# Patient Record
Sex: Male | Born: 1955 | Race: White | Hispanic: No | Marital: Married | Smoking: Never smoker
Health system: Southern US, Community
[De-identification: ages and names within clinical notes are randomized; demographics above are authoritative.]

## PROBLEM LIST (undated history)

## (undated) DIAGNOSIS — M549 Dorsalgia, unspecified: Secondary | ICD-10-CM

## (undated) DIAGNOSIS — Z87442 Personal history of urinary calculi: Secondary | ICD-10-CM

## (undated) DIAGNOSIS — N400 Enlarged prostate without lower urinary tract symptoms: Secondary | ICD-10-CM

## (undated) HISTORY — PX: TONSILLECTOMY: SUR1361

## (undated) HISTORY — DX: Dorsalgia, unspecified: M54.9

## (undated) HISTORY — PX: VASECTOMY: SHX75

## (undated) HISTORY — DX: Benign prostatic hyperplasia without lower urinary tract symptoms: N40.0

## (undated) HISTORY — PX: KNEE ARTHROCENTESIS: SUR44

## (undated) HISTORY — DX: Personal history of urinary calculi: Z87.442

## (undated) HISTORY — PX: ARTHROSCOPIC REPAIR ACL: SUR80

---

## 2011-05-19 ENCOUNTER — Emergency Department
Admission: EM | Admit: 2011-05-19 | Discharge: 2011-05-19 | Disposition: A | Discharge status: Home / Self Care | Payer: BC Managed Care – PPO | Source: Home / Self Care | Attending: Emergency Medicine | Admitting: Emergency Medicine

## 2011-05-19 DIAGNOSIS — R300 Dysuria: Secondary | ICD-10-CM

## 2011-05-19 LAB — POCT URINALYSIS DIP (MANUAL ENTRY)
Ketones, POC UA: NEGATIVE
Protein Ur, POC: NEGATIVE
Spec Grav, UA: >=1.03 (ref 1.005–1.03)

## 2011-05-19 MED ORDER — FLUCONAZOLE 150 MG PO TABS: 150.0000 mg | ORAL_TABLET | Freq: Once | ORAL | Status: AC

## 2011-05-19 MED ORDER — CIPROFLOXACIN HCL 500 MG PO TABS: 500.0000 mg | ORAL_TABLET | Freq: Two times a day (BID) | ORAL | Status: AC

## 2011-05-19 NOTE — ED Provider Notes (Signed)
History     CSN: [id number]  Arrival date & time 05/19/11  1748   First MD Initiated Contact with Patient 05/19/11 1757      Chief Complaint  Patient presents with  . Dysuria    (Consider location/radiation/quality/duration/timing/severity/associated sxs/prior treatment) Patient is a 56 y.o. male presenting with dysuria.  Dysuria   Garyn is a 56 y.o. male who presents today with UTI symptoms for a few days. His wife did have symptoms recently and has been treated, but he is unsure what she was treated for. No dysuria No frequency No urgency No hematuria + odor + white penile discharge No fever/chills + L lower abdominal pain No back pain No fatigue    History reviewed. No pertinent past medical history.  History reviewed. No pertinent past surgical history.  History reviewed. No pertinent family history.  History  Substance Use Topics  . Smoking status: Never Smoker   . Smokeless tobacco: Not on file  . Alcohol Use: No      Review of Systems  Genitourinary: Positive for dysuria.    Allergies  Review of patient's allergies indicates no known allergies.  Home Medications   Current Outpatient Rx  Name Route Sig Dispense Refill  . CIPROFLOXACIN HCL 500 MG PO TABS Oral Take 1 tablet (500 mg total) by mouth 2 (two) times daily. 14 tablet 0  . FLUCONAZOLE 150 MG PO TABS Oral Take 1 tablet (150 mg total) by mouth once. May repeat in 3 days 2 tablet 0    BP 120/80  Pulse 60  Temp(Src) 98.3 F (36.8 C) (Oral)  Ht 5\' 8"  (1.727 m)  Wt 160 lb 8 oz (72.802 kg)  BMI 24.40 kg/m2  SpO2 97%  Physical Exam  Nursing note and vitals reviewed. Constitutional: He is oriented to person, place, and time. He appears well-developed and well-nourished.  HENT:  Head: Normocephalic and atraumatic.  Eyes: No scleral icterus.  Neck: Neck supple.  Cardiovascular: Regular rhythm and normal heart sounds.   Pulmonary/Chest: Effort normal and breath sounds normal. No  respiratory distress.  Abdominal: Soft. Normal appearance and bowel sounds are normal. He exhibits no mass. There is no rebound, no guarding and no CVA tenderness.  Neurological: He is alert and oriented to person, place, and time.  Skin: Skin is warm and dry.  Psychiatric: He has a normal mood and affect. His speech is normal.    ED Course  Procedures (including critical care time)   Labs Reviewed  POCT URINALYSIS DIP (MANUAL ENTRY)  URINE CULTURE   No results found.   1. Dysuria       MDM  1) Take the prescribed antibiotic as directed.   Differential diagnosis includes urethritis, urinary tract infection, yeast infection.  Prescription for Cipro and Diflucan given. We'll send off a urine gonorrhea and Chlamydia. 2) A urinalysis was done in clinic.  A urine culture is pending.[name] 3) Follow up with your PCP or urologist if not improving or if worsening symptoms.   Lily Kocher, MD 05/19/11 [phone]

## 2011-05-19 NOTE — ED Notes (Signed)
Noted odor to urine with white discharge

## 2011-05-21 LAB — URINE CULTURE
Colony Count: NO GROWTH
Organism ID, Bacteria: NO GROWTH

## 2011-06-11 ENCOUNTER — Emergency Department
Admission: EM | Admit: 2011-06-11 | Discharge: 2011-06-11 | Disposition: A | Discharge status: Home / Self Care | Payer: BC Managed Care – PPO | Source: Home / Self Care | Attending: Emergency Medicine | Admitting: Emergency Medicine

## 2011-06-11 ENCOUNTER — Encounter: Payer: Self-pay | Admitting: Emergency Medicine

## 2011-06-11 DIAGNOSIS — R059 Cough, unspecified: Secondary | ICD-10-CM

## 2011-06-11 DIAGNOSIS — R05 Cough: Secondary | ICD-10-CM

## 2011-06-11 DIAGNOSIS — J069 Acute upper respiratory infection, unspecified: Secondary | ICD-10-CM

## 2011-06-11 MED ORDER — AMOXICILLIN-POT CLAVULANATE 875-125 MG PO TABS: 1.0000 | ORAL_TABLET | Freq: Two times a day (BID) | ORAL | Status: AC

## 2011-06-11 NOTE — ED Provider Notes (Addendum)
History     CSN: [id number]  Arrival date & time 06/11/11  1502   First MD Initiated Contact with Patient 06/11/11 1533      Chief Complaint  Patient presents with  . Cough    (Consider location/radiation/quality/duration/timing/severity/associated sxs/prior treatment) HPI Dajion is a 56 y.o. male who complains of onset of cold symptoms for a month, intermittent but worsening last few days. He works in a high school.  His daughter was here earlier today with similar symptoms, likely viral. + sore throat + cough No pleuritic pain No wheezing + nasal congestion + post-nasal drainage + sinus pain/pressure + chest congestion No itchy/red eyes No earache No hemoptysis No SOB No chills/sweats No fever No nausea No vomiting No abdominal pain No diarrhea No skin rashes No fatigue No myalgias No headache    History reviewed. No pertinent past medical history.  Past Surgical History  Procedure Date  . Knee arthrocentesis     Family History  Problem Relation Age of Onset  . Diabetes Maternal Uncle   . Diabetes Maternal Aunt     History  Substance Use Topics  . Smoking status: Never Smoker   . Smokeless tobacco: Not on file  . Alcohol Use: No      Review of Systems  All other systems reviewed and are negative.    Allergies  Review of patient's allergies indicates no known allergies.  Home Medications   Current Outpatient Rx  Name Route Sig Dispense Refill  . AMOXICILLIN-POT CLAVULANATE 875-125 MG PO TABS Oral Take 1 tablet by mouth 2 (two) times daily. 20 tablet 0    BP 111/75  Pulse 71  Temp(Src) 98.1 F (36.7 C) (Oral)  Resp 18  Ht 5\' 8"  (1.727 m)  Wt 158 lb (71.668 kg)  BMI 24.02 kg/m2  SpO2 98%  Physical Exam  Nursing note and vitals reviewed. Constitutional: He is oriented to person, place, and time. He appears well-developed and well-nourished.  HENT:  Head: Normocephalic and atraumatic.  Right Ear: Tympanic membrane, external ear  and ear canal normal.  Left Ear: Tympanic membrane, external ear and ear canal normal.  Nose: Mucosal edema and rhinorrhea present.  Mouth/Throat: Posterior oropharyngeal erythema present. No oropharyngeal exudate or posterior oropharyngeal edema.  Eyes: No scleral icterus.  Neck: Neck supple.  Cardiovascular: Regular rhythm and normal heart sounds.   Pulmonary/Chest: Effort normal and breath sounds normal. No respiratory distress.  Neurological: He is alert and oriented to person, place, and time.  Skin: Skin is warm and dry.  Psychiatric: He has a normal mood and affect. His speech is normal.    ED Course  Procedures (including critical care time)  Labs Reviewed - No data to display No results found.   1. Cough   2. Acute upper respiratory infections of unspecified site       MDM  1)  Take the prescribed antibiotic as instructed. 2)  Use nasal saline solution (over the counter) at least 3 times a day. 3)  Use over the counter decongestants like Zyrtec-D every 12 hours as needed to help with congestion.  If you have hypertension, do not take medicines with sudafed.  4)  Can take tylenol every 6 hours or motrin every 8 hours for pain or fever. 5)  Follow up with your primary doctor if no improvement in 5-7 days, sooner if increasing pain, fever, or new symptoms.        Lily Kocher, MD 06/11/11 1536  I have advised  him that if he is not getting better with over-the-counter cough medicine, that he can call back in a few days and we will call in some Cheratussin for him.  Lily Kocher, MD 06/11/11 1537

## 2011-06-11 NOTE — ED Notes (Signed)
Cough intermittently x 1 month; worsened 2 days ago; sinus congestion and hoarseness; rib pain.

## 2011-06-22 ENCOUNTER — Emergency Department
Admission: EM | Admit: 2011-06-22 | Discharge: 2011-06-22 | Disposition: A | Discharge status: Home / Self Care | Payer: BC Managed Care – PPO | Source: Home / Self Care | Attending: Emergency Medicine | Admitting: Emergency Medicine

## 2011-06-22 ENCOUNTER — Emergency Department
Admit: 2011-06-22 | Discharge: 2011-06-22 | Disposition: A | Discharge status: Home / Self Care | Payer: BC Managed Care – PPO

## 2011-06-22 DIAGNOSIS — R05 Cough: Secondary | ICD-10-CM

## 2011-06-22 DIAGNOSIS — R059 Cough, unspecified: Secondary | ICD-10-CM

## 2011-06-22 DIAGNOSIS — J069 Acute upper respiratory infection, unspecified: Secondary | ICD-10-CM

## 2011-06-22 MED ORDER — FLUTICASONE PROPIONATE 50 MCG/ACT NA SUSP: 2.0000 | Freq: Every day | NASAL | Status: DC

## 2011-06-22 MED ORDER — CLARITHROMYCIN 500 MG PO TABS: 500.0000 mg | ORAL_TABLET | Freq: Two times a day (BID) | ORAL | Status: AC

## 2011-06-22 MED ORDER — RANITIDINE HCL 150 MG PO TABS: 150.0000 mg | ORAL_TABLET | Freq: Two times a day (BID) | ORAL | Status: DC

## 2011-06-22 NOTE — ED Notes (Signed)
Cough x 6 weeks, just completed ABT

## 2011-06-22 NOTE — ED Provider Notes (Signed)
History     CSN: [id number]  Arrival date & time 06/22/11  1731   First MD Initiated Contact with Patient 06/22/11 1734      Chief Complaint  Patient presents with  . Cough  . Back Pain    (Consider location/radiation/quality/duration/timing/severity/associated sxs/prior treatment) HPI Jake Pearson is a 56 y.o. male who complains of cold symptoms.  He was here 2 weeks with similar symptoms and given 10 days of Augmentin.  Prior to that, he had been sick off and on for about a month.  He has a history of but has not had any reflux symptoms lately. He did not have a history of asthma or allergies. 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 No sore throat + cough with mild L sided back pain ? pleuritic pain No wheezing No nasal congestion +post-nasal drainage No sinus pain/pressure + chest congestion No itchy/red eyes No earache No hemoptysis No SOB No chills/sweats No fever No nausea No vomiting No abdominal pain No diarrhea No skin rashes No fatigue No myalgias No headache    History reviewed. No pertinent past medical history.  Past Surgical History  Procedure Date  . Knee arthrocentesis     Family History  Problem Relation Age of Onset  . Diabetes Maternal Uncle   . Diabetes Maternal Aunt     History  Substance Use Topics  . Smoking status: Never Smoker   . Smokeless tobacco: Not on file  . Alcohol Use: No      Review of Systems  All other systems reviewed and are negative.    Allergies  Review of patient's allergies indicates no known allergies.  Home Medications   Current Outpatient Rx  Name Route Sig Dispense Refill  . AMOXICILLIN-POT CLAVULANATE 875-125 MG PO TABS Oral Take 1 tablet by mouth 2 (two) times daily. 20 tablet 0  . CLARITHROMYCIN 500 MG PO TABS Oral Take 1 tablet (500 mg total) by mouth 2 (two)  times daily. 20 tablet 0  . FLUTICASONE PROPIONATE 50 MCG/ACT NA SUSP Nasal Place 2 sprays into the nose daily. 16 g 1  . RANITIDINE HCL 150 MG PO TABS Oral Take 1 tablet (150 mg total) by mouth 2 (two) times daily. 60 tablet 0    BP 123/78  Pulse 70  Temp(Src) 97.9 F (36.6 C) (Oral)  Resp 20  Ht 5\' 8"  (1.727 m)  Wt 163 lb 8 oz (74.163 kg)  BMI 24.86 kg/m2  SpO2 95%  Physical Exam  Nursing note and vitals reviewed. Constitutional: He is oriented to person, place, and time. He appears well-developed and well-nourished.  HENT:  Head: Normocephalic and atraumatic.  Right Ear: External ear and ear canal normal.  Left Ear: External ear and ear canal normal.  Nose: Nose normal.  Mouth/Throat: No oropharyngeal exudate, posterior oropharyngeal edema or posterior oropharyngeal erythema.       Bilateral clear fluid and mild erythema  Eyes: No scleral icterus.  Neck: Neck supple.  Cardiovascular: Regular rhythm and normal heart sounds.   Pulmonary/Chest: Effort normal and breath sounds normal. No respiratory distress.  Neurological: He is alert and oriented to person, place, and time.  Skin: Skin is warm and dry.  Psychiatric: He has a normal mood and affect. His speech is normal.    ED Course  Procedures (including critical care time)  Labs Reviewed -  No data to display Dg Chest 2 View  06/22/2011  *RADIOLOGY REPORT*  Clinical Data: Persistent cough  CHEST - 2 VIEW  Comparison:  None.  Findings:  The heart size and mediastinal contours are within normal limits.  Both lungs are clear.  The visualized skeletal structures are unremarkable.  IMPRESSION: No active cardiopulmonary disease.  Original Report Authenticated By: Judie Petit. TREVOR Miles Costain, M.D.     1. Cough   2. Acute upper respiratory infections of unspecified site       MDM  1)  Chest Xray is obtained and read by the radiologist as above (normal). I discussed with the patient the differential diagnosis of a chronic cough  including infection which he took Augmentin, however this would not cover pertussis so will prescribe clarithromycin for 10 days. In addition it could very likely be GERD and I've advised him to use over-the-counter Zantac for the next 3-4 weeks. In addition treating with a nasal steroid such as Flonase may be appropriate to try.  Use nasal saline solution (over the counter) at least 3 times a day. If his symptoms are continuing, then the next appropriate followup with an ENT or his PCP.    Lily Kocher, MD 06/22/11 [phone]

## 2012-10-23 ENCOUNTER — Encounter: Payer: Self-pay | Admitting: Family Medicine

## 2012-10-23 ENCOUNTER — Ambulatory Visit (INDEPENDENT_AMBULATORY_CARE_PROVIDER_SITE_OTHER): Payer: BC Managed Care – PPO | Admitting: Family Medicine

## 2012-10-23 VITALS — BP 109/77 | HR 65 | Ht 68.0 in | Wt 164.0 lb

## 2012-10-23 DIAGNOSIS — M25512 Pain in left shoulder: Secondary | ICD-10-CM

## 2012-10-23 DIAGNOSIS — Z0000 Encounter for general adult medical examination without abnormal findings: Secondary | ICD-10-CM

## 2012-10-23 DIAGNOSIS — M25519 Pain in unspecified shoulder: Secondary | ICD-10-CM

## 2012-10-23 MED ORDER — CYCLOBENZAPRINE HCL 10 MG PO TABS: 10.0000 mg | ORAL_TABLET | Freq: Every evening | ORAL | Status: DC | PRN

## 2012-10-23 MED ORDER — MELOXICAM 7.5 MG PO TABS: 7.5000 mg | ORAL_TABLET | Freq: Every day | ORAL | Status: DC

## 2012-10-23 NOTE — Patient Instructions (Signed)
Please schedule a physical at time.  Call if not better in 3 weeeks with your shoulder.

## 2012-10-23 NOTE — Progress Notes (Signed)
Subjective:    Patient ID: Jake Pearson, male    DOB: 1955/11/23, 57 y.o.   MRN: [id number]  HPI Here to estab care. No meds on regular basis.  left shoulder pain x 1 week no injury pt stated that pain began after waking up one morning.  Would occ bother him on and off x 1 years. Occ pops. Occ feels stiff. Worse at night this last week.  No old injuries. Harder to reach full ROM.  No known injuries. Worse with activity and better at rest. No radiation of pain. The he has noticed some discomfort in his neck as well.   Review of Systems  Constitutional: Negative for fever, diaphoresis and unexpected weight change.  HENT: Negative for hearing loss, rhinorrhea, sneezing and tinnitus.   Eyes: Negative for visual disturbance.  Respiratory: Negative for cough and wheezing.   Cardiovascular: Negative for chest pain and palpitations.  Gastrointestinal: Negative for nausea, vomiting, diarrhea and blood in stool.  Genitourinary: Negative for dysuria and discharge.  Musculoskeletal: Positive for arthralgias. Negative for myalgias.  Skin: Negative for rash.  Neurological: Negative for headaches.  Hematological: Negative for adenopathy.  Psychiatric/Behavioral: Negative for sleep disturbance and dysphoric mood. The patient is not nervous/anxious.        BP 109/77  Pulse 65  Ht 5\' 8"  (1.727 m)  Wt 164 lb (74.39 kg)  BMI 24.94 kg/m2    No Known Allergies  No past medical history on file.  Past Surgical History  Procedure Laterality Date  . Knee arthrocentesis    . Tonsillectomy  Age 29   . Arthroscopic repair acl    . Vasectomy      History   Social History  . Marital Status: Married    Spouse Name: N/A    Number of Children: 2  . Years of Education: N/A   Occupational History  .     Social History Main Topics  . Smoking status: Never Smoker   . Smokeless tobacco: Not on file  . Alcohol Use: 0.5 oz/week    1 drink(s) per week  . Drug Use: No  . Sexually Active: Yes --  Male partner(s)   Other Topics Concern  . 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  Has 2 kids.     Family History  Problem Relation Age of Onset  . Diabetes Maternal Uncle   . Diabetes Maternal Aunt   . Heart attack Maternal Grandfather   . Heart attack Maternal Uncle     Outpatient Encounter Prescriptions as of 10/23/2012  Medication Sig Dispense Refill  . cyclobenzaprine (FLEXERIL) 10 MG tablet Take 1 tablet (10 mg total) by mouth at bedtime as needed for muscle spasms.  30 tablet  0  . meloxicam (MOBIC) 7.5 MG tablet Take 1-2 tablets (7.5-15 mg total) by mouth daily.  60 tablet  0  . [DISCONTINUED] fluticasone (FLONASE) 50 MCG/ACT nasal spray Place 2 sprays into the nose daily.  16 g  1  . [DISCONTINUED] ranitidine (ZANTAC) 150 MG tablet Take 1 tablet (150 mg total) by mouth 2 (two) times daily.  60 tablet  0   No facility-administered encounter medications on file as of 10/23/2012.       Objective:   Physical Exam  Constitutional: He is oriented to person, place, and time. He appears well-developed and well-nourished.  HENT:  Head: Normocephalic and atraumatic.  Cardiovascular: Normal rate, regular rhythm and normal heart sounds.  Pulmonary/Chest: Effort normal and breath sounds normal.  Musculoskeletal:  Left shoulder with NROM except decreased reach across.  Neg empty can test.  Shoulder, elbow strencth is 5/5.  Neck with NROM and pain with rotation and flexion to the left. Nontender over the actual shoulder joint. Did have some tenderness over the upper trapezius muscle  Neurological: He is alert and oriented to person, place, and time.  Skin: Skin is warm and dry.  Psychiatric: He has a normal mood and affect. His behavior is normal.          Assessment & Plan:  Left shoulder pain- most consistent with trapezius strain. He is actually tender over the top of the trapezius muscle itself. We will try a muscle extra bedtime and see  if this helps it may also help with his sleep quality since the pain often is keeping him awake. I think he also has some component of possible bursitis. No sign of rotator cuff tear or problems. Recommend a perception anti-inflammatory, Mobic. Take with food and water to avoid any GI upset. If not noticing significant improvement in the next 2-3 weeks in followup for further evaluation and treatment. No actual, or injury I did not do any x-rays today. He may have some mild component of impingement syndrome as well as he did have discomfort with crossing over to the other shoulder. Given handout on stretches for the neck and for the shoulder. Encouraged him to start these today.   He plans on scheduling a physical later this month. He would like to go ahead and get a lab slip. The last time he had blood work done was about 6 years ago. He does report having had a colonoscopy at age 17 that was normal. He says he really can't remember where he went. He also feels that his tetanus is up-to-date.

## 2012-10-25 LAB — LIPID PANEL
LDL Cholesterol: 106 mg/dL — ABNORMAL HIGH (ref 0–99)
VLDL: 20 mg/dL (ref 0–40)

## 2012-10-25 LAB — COMPLETE METABOLIC PANEL WITH GFR
ALT: 37 U/L (ref 0–53)
AST: 22 U/L (ref 0–37)
Albumin: 4.2 g/dL (ref 3.5–5.2)
Calcium: 8.9 mg/dL (ref 8.4–10.5)
Chloride: 106 mEq/L (ref 96–112)
Potassium: 4.2 mEq/L (ref 3.5–5.3)

## 2012-10-31 ENCOUNTER — Encounter: Payer: BC Managed Care – PPO | Admitting: Family Medicine

## 2012-11-02 ENCOUNTER — Emergency Department (INDEPENDENT_AMBULATORY_CARE_PROVIDER_SITE_OTHER)
Admission: EM | Admit: 2012-11-02 | Discharge: 2012-11-02 | Disposition: A | Discharge status: Home / Self Care | Payer: BC Managed Care – PPO | Source: Home / Self Care | Attending: Family Medicine | Admitting: Family Medicine

## 2012-11-02 ENCOUNTER — Encounter: Payer: Self-pay | Admitting: *Deleted

## 2012-11-02 DIAGNOSIS — R300 Dysuria: Secondary | ICD-10-CM

## 2012-11-02 LAB — POCT URINALYSIS DIP (MANUAL ENTRY)
Nitrite, UA: NEGATIVE
Urobilinogen, UA: 0.2 (ref 0–1)
pH, UA: 7 (ref 5–8)

## 2012-11-02 MED ORDER — DOXYCYCLINE HYCLATE 100 MG PO CAPS: 100.0000 mg | ORAL_CAPSULE | Freq: Two times a day (BID) | ORAL | Status: DC

## 2012-11-02 NOTE — ED Notes (Signed)
Pt c/o dysuria, frequent urination and penile d/c x 4 days. Denies fever. No OTC meds.

## 2012-11-02 NOTE — ED Provider Notes (Signed)
History    CSN: [id number] Arrival date & time 11/02/12  1140  None    Chief Complaint  Patient presents with  . Dysuria      HPI Comments: Patient complains of 4 day history of frequency, dysuria, nocturia, and urethral discharge.  No fevers, chills, and sweats, and no testicular pain.  He has had mild nausea but no vomiting.  Patient is a 57 y.o. male presenting with dysuria. The history is provided by the patient.  Dysuria This is a new problem. Episode onset: 4 days ago. The problem occurs constantly. The problem has been gradually worsening. Associated symptoms comments: none. Nothing aggravates the symptoms. Nothing relieves the symptoms. He has tried nothing for the symptoms.   History reviewed. No pertinent past medical history. Past Surgical History  Procedure Laterality Date  . Knee arthrocentesis    . Tonsillectomy  Age 60   . Arthroscopic repair acl    . Vasectomy     Family History  Problem Relation Age of Onset  . Diabetes Maternal Uncle   . Diabetes Maternal Aunt   . Heart attack Maternal Grandfather   . Heart attack Maternal Uncle    History  Substance Use Topics  . Smoking status: Never Smoker   . Smokeless tobacco: Not on file  . Alcohol Use: 0.5 oz/week    1 drink(s) per week    Review of Systems  Genitourinary: Positive for dysuria.  All other systems reviewed and are negative.    Allergies  Review of patient's allergies indicates no known allergies.  Home Medications   Current Outpatient Rx  Name  Route  Sig  Dispense  Refill  . cyclobenzaprine (FLEXERIL) 10 MG tablet   Oral   Take 1 tablet (10 mg total) by mouth at bedtime as needed for muscle spasms.   30 tablet   0   . doxycycline (VIBRAMYCIN) 100 MG capsule   Oral   Take 1 capsule (100 mg total) by mouth 2 (two) times daily.   14 capsule   0   . meloxicam (MOBIC) 7.5 MG tablet   Oral   Take 1-2 tablets (7.5-15 mg total) by mouth daily.   60 tablet   0    BP 117/75   Pulse 63  Temp(Src) 98.2 F (36.8 C) (Oral)  Resp 16  Ht 5\' 8"  (1.727 m)  Wt 159 lb (72.122 kg)  BMI 24.18 kg/m2  SpO2 99% Physical Exam Nursing notes and Vital Signs reviewed. Appearance:  Patient appears healthy, stated age, and in no acute distress Eyes:  Pupils are equal, round, and reactive to light and accomodation.  Extraocular movement is intact.  Conjunctivae are not inflamed  Mouth/Pharynx:  Normal Neck:  Supple.   No adenopathy Lungs:  Clear to auscultation.  Breath sounds are equal.  Heart:  Regular rate and rhythm without murmurs, rubs, or gallops.  Abdomen:  Nontender without masses or hepatosplenomegaly.  Bowel sounds are present.  No CVA or flank tenderness.  Extremities:  No edema.  No calf tenderness Skin:  No rash present.   ED Course  Procedures  None   Labs Reviewed  URINE CULTURE pending  GC/CHLAMYDIA PROBE AMP, URINE pending  POCT URINALYSIS DIP (MANUAL ENTRY) SG >= 1.030; BLO moderate; PRO 100mg /dL; LEU moderate    1. Dysuria     MDM  Urine culture, and GC/chlamydia pending Begin doxycycline Increase fluid intake. Followup with Family Doctor if not improved in one week.   Lattie Haw,  MD 11/02/12 [id number]

## 2012-11-04 ENCOUNTER — Telehealth: Payer: Self-pay

## 2012-11-04 NOTE — ED Notes (Signed)
Patient given results of labs after confirmation of password.

## 2012-11-05 ENCOUNTER — Telehealth: Payer: Self-pay

## 2012-11-05 LAB — URINE CULTURE: Colony Count: >=100000

## 2012-11-05 MED ORDER — CIPROFLOXACIN HCL 500 MG PO TABS: 500.0000 mg | ORAL_TABLET | Freq: Two times a day (BID) | ORAL | Status: DC

## 2012-11-05 NOTE — ED Notes (Signed)
Patient advised to stop the doxycycline and start cipro, per Dr Alvester Morin.

## 2012-11-06 ENCOUNTER — Encounter: Payer: BC Managed Care – PPO | Admitting: Family Medicine

## 2012-11-10 ENCOUNTER — Ambulatory Visit (INDEPENDENT_AMBULATORY_CARE_PROVIDER_SITE_OTHER): Payer: BC Managed Care – PPO | Admitting: Family Medicine

## 2012-11-10 ENCOUNTER — Encounter: Payer: Self-pay | Admitting: Family Medicine

## 2012-11-10 VITALS — BP 121/77 | HR 60 | Ht 68.0 in | Wt 162.0 lb

## 2012-11-10 DIAGNOSIS — Z0000 Encounter for general adult medical examination without abnormal findings: Secondary | ICD-10-CM

## 2012-11-10 DIAGNOSIS — Z1211 Encounter for screening for malignant neoplasm of colon: Secondary | ICD-10-CM

## 2012-11-10 NOTE — Patient Instructions (Addendum)
Shingles Vaccine What You Need to Know WHAT IS SHINGLES?  Shingles is a painful skin rash, often with blisters. It is also called Herpes Zoster or just Zoster.  A shingles rash usually appears on one side of the face or body and lasts from 2 to 4 weeks. Its main symptom is pain, which can be quite severe. Other symptoms of shingles can include fever, headache, chills, and upset stomach. Very rarely, a shingles infection can lead to pneumonia, hearing problems, blindness, brain inflammation (encephalitis), or death.  For about 1 person in 5, severe pain can continue even after the rash clears up. This is called post-herpetic neuralgia.  Shingles is caused by the Varicella Zoster virus. This is the same virus that causes chickenpox. Only someone who has had a case of chickenpox or rarely, has gotten chickenpox vaccine, can get shingles. The virus stays in your body. It can reappear many years later to cause a case of shingles.  You cannot catch shingles from another person with shingles. However, a person who has never had chickenpox (or chickenpox vaccine) could get chickenpox from someone with shingles. This is not very common.  Shingles is far more common in people 50 and older than in younger people. It is also more common in people whose immune systems are weakened because of a disease such as cancer or drugs such as steroids or chemotherapy.  At least 1 million people get shingles per year in the [place]. SHINGLES VACCINE  A vaccine for shingles was licensed in 2006. In clinical trials, the vaccine reduced the risk of shingles by 50%. It can also reduce the pain in people who still get shingles after being vaccinated.  A single dose of shingles vaccine is recommended for adults 60 years of age and older. SOME PEOPLE SHOULD NOT GET SHINGLES VACCINE OR SHOULD WAIT A person should not get shingles vaccine if he or she:  Has ever had a life-threatening allergic reaction to gelatin, the  antibiotic neomycin, or any other component of shingles vaccine. Tell your caregiver if you have any severe allergies.  Has a weakened immune system because of current:  AIDS or another disease that affects the immune system.  Treatment with drugs that affect the immune system, such as prolonged use of high-dose steroids.  Cancer treatment, such as radiation or chemotherapy.  Cancer affecting the bone marrow or lymphatic system, such as leukemia or lymphoma.  Is pregnant, or might be pregnant. Women should not become pregnant until at least 4 weeks after getting shingles vaccine. Someone with a minor illness, such as a cold, may be vaccinated. Anyone with a moderate or severe acute illness should usually wait until he or she recovers before getting the vaccine. This includes anyone with a temperature of 101.3 F (38 C) or higher. WHAT ARE THE RISKS FROM SHINGLES VACCINE?  A vaccine, like any medicine, could possibly cause serious problems, such as severe allergic reactions. However, the risk of a vaccine causing serious harm, or death, is extremely small.  No serious problems have been identified with shingles vaccine. Mild Problems  Redness, soreness, swelling, or itching at the site of the injection (about 1 person in 3).  Headache (about 1 person in 70). Like all vaccines, shingles vaccine is being closely monitored for unusual or severe problems. WHAT IF THERE IS A MODERATE OR SEVERE REACTION? What should I look for? Any unusual condition, such as a severe allergic reaction or a high fever. If a severe allergic reaction   occurred, it would be within a few minutes to an hour after the shot. Signs of a serious allergic reaction can include difficulty breathing, weakness, hoarseness or wheezing, a fast heartbeat, hives, dizziness, paleness, or swelling of the throat. What should I do?  Call your caregiver, or get the person to a caregiver right away.  Tell the caregiver what  happened, the date and time it happened, and when the vaccination was given.  Ask the caregiver to report the reaction by filing a Vaccine Adverse Event Reporting System (VAERS) form. Or, you can file this report through the VAERS web site at www.vaers.hhs.gov or by calling 1-800-822-7967. VAERS does not provide medical advice. HOW CAN I LEARN MORE?  Ask your caregiver. He or she can give you the vaccine package insert or suggest other sources of information.  Contact the Centers for Disease Control and Prevention (CDC):  Call 1-800-232-4636 (1-800-CDC-INFO).  Visit the CDC website at www.cdc.gov/vaccines CDC Shingles Vaccine VIS (01/23/08) Document Released: 01/31/2006 Document Revised: 06/28/2011 Document Reviewed: 01/23/2008 Exit[employer] Patient Information 2014 [employer].  

## 2012-11-10 NOTE — Progress Notes (Signed)
Subjective:    Patient ID: Jake Pearson, male    DOB: 1955-11-08, 57 y.o.   MRN: [id number]  HPI Here for complete physical exam today. He has no specific complaints. The last time he saw eye doctor was a couple years ago and he has noticed some mild vision changes since then.  He is currently on ciprofloxacin for a urinary tract infection. They switched his antibiotic about 4 or 5 days ago to Cipro based on sensitivities from the culture. He is doing some better.  He said he did have a colonoscopy about 10 years ago in his 63s. He is due.  He thinks his tdap is up to date. He feels like he's probably had in the last 10 years.   Review of Systems Comprehensive review of systems is negative.  BP 121/77  Pulse 60  Ht 5\' 8"  (1.727 m)  Wt 162 lb (73.483 kg)  BMI 24.64 kg/m2    No Known Allergies  No past medical history on file.  Past Surgical History  Procedure Laterality Date  . Knee arthrocentesis    . Tonsillectomy  Age 79   . Arthroscopic repair acl    . Vasectomy      History   Social History  . Marital Status: Married    Spouse Name: Aram Beecham    Number of Children: 2  . Years of Education: N/A   Occupational History  .     Social History Main Topics  . Smoking status: Never Smoker   . Smokeless tobacco: Not on file  . Alcohol Use: 0.5 oz/week    1 drink(s) per week  . Drug Use: No  . Sexually Active: Yes -- Male partner(s)   Other Topics Concern  . 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  Has 2 kids.     Family History  Problem Relation Age of Onset  . Diabetes Maternal Uncle   . Diabetes Maternal Aunt   . Heart attack Maternal Grandfather   . Heart attack Maternal Uncle     Outpatient Encounter Prescriptions as of 11/10/2012  Medication Sig Dispense Refill  . ciprofloxacin (CIPRO) 500 MG tablet Take 1 tablet (500 mg total) by mouth 2 (two) times daily.  14 tablet  0  . [DISCONTINUED] cyclobenzaprine  (FLEXERIL) 10 MG tablet Take 1 tablet (10 mg total) by mouth at bedtime as needed for muscle spasms.  30 tablet  0  . [DISCONTINUED] doxycycline (VIBRAMYCIN) 100 MG capsule Take 1 capsule (100 mg total) by mouth 2 (two) times daily.  14 capsule  0  . [DISCONTINUED] meloxicam (MOBIC) 7.5 MG tablet Take 1-2 tablets (7.5-15 mg total) by mouth daily.  60 tablet  0   No facility-administered encounter medications on file as of 11/10/2012.          Objective:   Physical Exam  Constitutional: He is oriented to person, place, and time. He appears well-developed and well-nourished.  HENT:  Head: Normocephalic and atraumatic.  Right Ear: External ear normal.  Left Ear: External ear normal.  Nose: Nose normal.  Mouth/Throat: Oropharynx is clear and moist.  Eyes: Conjunctivae and EOM are normal. Pupils are equal, round, and reactive to light.  Neck: Normal range of motion. Neck supple. No thyromegaly present.  Cardiovascular: Normal rate, regular rhythm, normal heart sounds and intact distal pulses.   No carotid or abdominal bruits.  Pulmonary/Chest: Effort normal and breath sounds normal.  Abdominal: Soft.  Bowel sounds are normal. He exhibits no distension and no mass. There is no tenderness. There is no rebound and no guarding.  Musculoskeletal: Normal range of motion. He exhibits no edema.  Lymphadenopathy:    He has no cervical adenopathy.  Neurological: He is alert and oriented to person, place, and time. He has normal reflexes.  Skin: Skin is warm and dry.  Psychiatric: He has a normal mood and affect. His behavior is normal. Judgment and thought content normal.          Assessment & Plan:  CPE You are to have blood work done. I printed a copy for him and we went over the results today. Next  We also discussed: cancer screening. He is due. Referral was placed.  We also discussed prostate cancer screening and the pros and cons of doing this. He opted out of digital rectal exam and  PSA screening this year but says will consider for next year.  He reports that his tetanus is up-to-date. We'll try to confirm this when we get his old records.  UTI-improving on Cipro. Complete full course. Follow up if not completely resolving.  Keep up a regular exercise program and make sure you are eating a healthy diet Try to eat 4 servings of dairy a day, or if you are lactose intolerant take a calcium with vitamin D daily.  Your vaccines are up to date.

## 2013-02-08 ENCOUNTER — Emergency Department (INDEPENDENT_AMBULATORY_CARE_PROVIDER_SITE_OTHER)
Admission: EM | Admit: 2013-02-08 | Discharge: 2013-02-08 | Disposition: A | Discharge status: Home / Self Care | Payer: BC Managed Care – PPO | Source: Home / Self Care | Attending: Emergency Medicine | Admitting: Emergency Medicine

## 2013-02-08 ENCOUNTER — Encounter: Payer: Self-pay | Admitting: Emergency Medicine

## 2013-02-08 DIAGNOSIS — H6640 Suppurative otitis media, unspecified, unspecified ear: Secondary | ICD-10-CM

## 2013-02-08 DIAGNOSIS — J0100 Acute maxillary sinusitis, unspecified: Secondary | ICD-10-CM

## 2013-02-08 MED ORDER — AMOXICILLIN-POT CLAVULANATE 875-125 MG PO TABS: 1.0000 | ORAL_TABLET | Freq: Two times a day (BID) | ORAL | Status: DC

## 2013-02-08 NOTE — ED Notes (Signed)
Chrissie Noa c/o productive cough, congestion x 1 week without fever. Taken Mucinex DM OTC.

## 2013-02-08 NOTE — ED Provider Notes (Signed)
CSN: [id number]     Arrival date & time 02/08/13  1620 History   First MD Initiated Contact with Patient 02/08/13 1623     Chief Complaint  Patient presents with  . URI   (Consider location/radiation/quality/duration/timing/severity/associated sxs/prior Treatment) The history is provided by the patient.  URI HISTORY  Jake Pearson is a 57 y.o. male who complains of onset of cold symptoms for several days.  Have been using over-the-counter treatment, such as Mucinex which helps a little bit.  No chills/sweats + Low-grade  Fever  +  Nasal congestion +  Discolored Post-nasal drainage Positive sinus pain/pressure No sore throat  Minimal cough No wheezing No chest congestion No hemoptysis No shortness of breath No pleuritic pain  No itchy/red eyes Positive bilateral earache, denies any ear drainage  No nausea No vomiting No abdominal pain No diarrhea  No skin rashes +  Fatigue No myalgias No headache   History reviewed. No pertinent past medical history. Past Surgical History  Procedure Laterality Date  . Knee arthrocentesis    . Tonsillectomy  Age 60   . Arthroscopic repair acl    . Vasectomy     Family History  Problem Relation Age of Onset  . Diabetes Maternal Uncle   . Diabetes Maternal Aunt   . Heart attack Maternal Grandfather   . Heart attack Maternal Uncle    History  Substance Use Topics  . Smoking status: Never Smoker   . Smokeless tobacco: Never Used  . Alcohol Use: 0.5 oz/week    1 drink(s) per week    Review of Systems  All other systems reviewed and are negative.    Allergies  Review of patient's allergies indicates no known allergies.  Home Medications   Current Outpatient Rx  Name  Route  Sig  Dispense  Refill  . amoxicillin-clavulanate (AUGMENTIN) 875-125 MG per tablet   Oral   Take 1 tablet by mouth 2 (two) times daily. Take with food.   20 tablet   0    BP 123/78  Pulse 64  Temp(Src) 98.3 F (36.8 C) (Oral)  Resp 16  Ht  5\' 8"  (1.727 m)  Wt 158 lb (71.668 kg)  BMI 24.03 kg/m2  SpO2 97% Physical Exam  Nursing note and vitals reviewed. Constitutional: He is oriented to person, place, and time. He appears well-developed and well-nourished. No distress.  HENT:  Head: Normocephalic and atraumatic.  Right Ear: External ear and ear canal normal. No drainage or tenderness. Tympanic membrane is erythematous and retracted. Tympanic membrane is not perforated. A middle ear effusion is present. No decreased hearing is noted.  Left Ear: External ear and ear canal normal. No drainage or tenderness. Tympanic membrane is erythematous and retracted. Tympanic membrane is not perforated. A middle ear effusion is present. Decreased hearing (mild) is noted.  Nose: Mucosal edema and rhinorrhea present. Right sinus exhibits maxillary sinus tenderness. Left sinus exhibits maxillary sinus tenderness.  Mouth/Throat: Oropharynx is clear and moist. No oral lesions. No oropharyngeal exudate.  Eyes: Right eye exhibits no discharge. Left eye exhibits no discharge. No scleral icterus.  Neck: Neck supple.  Cardiovascular: Normal rate, regular rhythm and normal heart sounds.   Pulmonary/Chest: Effort normal and breath sounds normal. He has no wheezes. He has no rales.  Lymphadenopathy:    He has no cervical adenopathy.  Neurological: He is alert and oriented to person, place, and time.  Skin: Skin is warm and dry.    ED Course  Procedures (including critical care time)  Labs Review Labs Reviewed - No data to display Imaging Review No results found.  EKG Interpretation     Ventricular Rate:    PR Interval:    QRS Duration:   QT Interval:    QTC Calculation:   R Axis:     Text Interpretation:              MDM   1. Acute maxillary sinusitis   2. Otitis media, purulent, bilateral    Augmentin 875 twice a day x10 days Other symptomatic care discussed.  I offered and advised steroid nasal sprays such as Flonase, but  he declined. Mucinex D every morning and Mucinex in p.m. Because of the severity of his sinusitis and purulent bilateral otitis media, and advised to followup with an ENT within 10-14 days, sooner if worse or new symptoms. Precautions discussed. Red flags discussed. Questions invited and answered. Patient voiced understanding and agreement.    Lajean Manes, MD 02/08/13 [phone]

## 2013-03-20 ENCOUNTER — Emergency Department (INDEPENDENT_AMBULATORY_CARE_PROVIDER_SITE_OTHER): Payer: BC Managed Care – PPO

## 2013-03-20 ENCOUNTER — Emergency Department (INDEPENDENT_AMBULATORY_CARE_PROVIDER_SITE_OTHER)
Admission: EM | Admit: 2013-03-20 | Discharge: 2013-03-20 | Disposition: A | Discharge status: Home / Self Care | Payer: BC Managed Care – PPO | Source: Home / Self Care | Attending: Emergency Medicine | Admitting: Emergency Medicine

## 2013-03-20 ENCOUNTER — Encounter: Payer: Self-pay | Admitting: Emergency Medicine

## 2013-03-20 DIAGNOSIS — M25569 Pain in unspecified knee: Secondary | ICD-10-CM

## 2013-03-20 DIAGNOSIS — R937 Abnormal findings on diagnostic imaging of other parts of musculoskeletal system: Secondary | ICD-10-CM

## 2013-03-20 DIAGNOSIS — M25561 Pain in right knee: Secondary | ICD-10-CM

## 2013-03-20 DIAGNOSIS — IMO0002 Reserved for concepts with insufficient information to code with codable children: Secondary | ICD-10-CM

## 2013-03-20 DIAGNOSIS — M7050 Other bursitis of knee, unspecified knee: Secondary | ICD-10-CM

## 2013-03-20 MED ORDER — IBUPROFEN 200 MG PO TABS: ORAL_TABLET | ORAL | Status: DC

## 2013-03-20 NOTE — ED Notes (Signed)
[place] c/o medial right knee pain without injury x 2 weeks. He reports over the weekend his knee was bruised. About 1 1/2 weeks before the pain started he hit the top of his knee on a desk causing an abrasion. No previous injuries.

## 2013-03-20 NOTE — ED Provider Notes (Signed)
CSN: [id number]     Arrival date & time 03/20/13  1612 History   First MD Initiated Contact with Patient 03/20/13 1634     Chief Complaint  Patient presents with  . Knee Pain    right   (Consider location/radiation/quality/duration/timing/severity/associated sxs/prior Treatment) HPI Jake Pearson c/o medial right knee pain without injury x 2 weeks. Although he may have bumped it 2 weeks ago. He reports over the weekend his knee was bruised, discolored and black and blue superior and medially. After further questioning, he recalls that  about 1 and 1/2 weeks before the pain started he hit the top of his knee on a desk causing an abrasion. No other previous injuries.  The pain is sharp at times, especially when he turns, or when he's sitting for a long time and then gets up quickly. Might hurt when he starts to walk but then as he loosens up and continues to walk, the pain goes away.  No radiation or paresthesias or calf pain. No fever or drainage or bleeding.  Denies history of chronic arthritis  History reviewed. No pertinent past medical history. Past Surgical History  Procedure Laterality Date  . Knee arthrocentesis    . Tonsillectomy  Age 57   . Arthroscopic repair acl    . Vasectomy     Family History  Problem Relation Age of Onset  . Diabetes Maternal Uncle   . Diabetes Maternal Aunt   . Heart attack Maternal Grandfather   . Heart attack Maternal Uncle    History  Substance Use Topics  . Smoking status: Never Smoker   . Smokeless tobacco: Never Used  . Alcohol Use: 0.5 oz/week    1 drink(s) per week    Review of Systems  All other systems reviewed and are negative.    Allergies  Review of patient's allergies indicates no known allergies.  Home Medications   Current Outpatient Rx  Name  Route  Sig  Dispense  Refill  . ibuprofen (ADVIL,MOTRIN) 200 MG tablet      Take three tablets ( 600 milligrams total) every 6 with food as needed for pain.   30 tablet   0     BP 136/83  Pulse 62  Resp 14  Wt 164 lb (74.39 kg)  SpO2 99% Physical Exam  Nursing note and vitals reviewed. Constitutional: He is oriented to person, place, and time. He appears well-developed and well-nourished. No distress.  HENT:  Head: Normocephalic and atraumatic.  Eyes: Conjunctivae and EOM are normal. Pupils are equal, round, and reactive to light. No scleral icterus.  Neck: Normal range of motion.  Cardiovascular: Normal rate.   Pulmonary/Chest: Effort normal.  Abdominal: He exhibits no distension.  Musculoskeletal: Normal range of motion.       Right knee: He exhibits swelling (Minimal, medially), ecchymosis (minimal, resolving) and bony tenderness. He exhibits normal range of motion, no effusion, no deformity, no laceration, no erythema, no LCL laxity, normal patellar mobility and no MCL laxity. Tenderness (Over pes anserine bursa) found. Medial joint line tenderness noted. No lateral joint line tenderness noted.  Lockman's and McMurray's negative  Neurological: He is alert and oriented to person, place, and time.  Skin: Skin is warm.  Psychiatric: He has a normal mood and affect.   No heat or red streaks on the skin of right knee. Homans sign negative. ED Course  Procedures (including critical care time) Labs Review Labs Reviewed - No data to display Imaging Review Dg Knee Complete 4 Views  Right  03/20/2013   CLINICAL DATA:  Right medial knee pain for 1 week, no injury  EXAM: RIGHT KNEE - COMPLETE 4+ VIEW  COMPARISON:  None.  FINDINGS: The knee joint spaces are relatively normal with minimal spurring from the superior aspect of the patella. No fracture is seen and no effusion is noted.  IMPRESSION: No significant abnormality. Minimal degenerative spurring from the patella.   Electronically Signed   By: Dwyane Dee M.D.   On: 03/20/2013 16:52    EKG Interpretation    Date/Time:    Ventricular Rate:    PR Interval:    QRS Duration:   QT Interval:    QTC  Calculation:   R Axis:     Text Interpretation:              MDM   1. Pes anserine bursitis   2. Knee pain, acute, right    No evidence of any significant abnormality on x-ray. Minimal degenerative spurring but otherwise no acute bony abnormalities. No fracture. No joint effusion. He likely has right has anserine bursitis.--No evidence of medial collateral ligament disruption. It's possible that he bruised his right medial meniscus, but has no other signs or symptoms of this. McMurray's sign negative . After risks, benefits, alternatives discussed, he prefers conservative treatment. Questions invited and answered. Ace bandage. OTC ibuprofen up to 800 mg 3 times a day p.c. Heat. Stretching and warmup exercises before starting to walk. Followup with Dr. Benjamin Stain if no better 1 to 2 weeks, sooner prn. Precautions discussed. Red flags discussed. Questions invited and answered. Patient voiced understanding and agreement.    Lajean Manes, MD 03/20/13 1710

## 2013-05-15 DIAGNOSIS — N400 Enlarged prostate without lower urinary tract symptoms: Secondary | ICD-10-CM | POA: Insufficient documentation

## 2015-04-11 ENCOUNTER — Encounter: Payer: Self-pay | Admitting: Emergency Medicine

## 2015-04-11 ENCOUNTER — Emergency Department (INDEPENDENT_AMBULATORY_CARE_PROVIDER_SITE_OTHER)
Admission: EM | Admit: 2015-04-11 | Discharge: 2015-04-11 | Disposition: A | Discharge status: Home / Self Care | Payer: BC Managed Care – PPO | Source: Home / Self Care | Attending: Family Medicine | Admitting: Family Medicine

## 2015-04-11 DIAGNOSIS — J209 Acute bronchitis, unspecified: Secondary | ICD-10-CM

## 2015-04-11 MED ORDER — AZITHROMYCIN 250 MG PO TABS: ORAL_TABLET | ORAL | Status: DC

## 2015-04-11 MED ORDER — PREDNISONE 20 MG PO TABS: 20.0000 mg | ORAL_TABLET | Freq: Two times a day (BID) | ORAL | Status: DC

## 2015-04-11 MED ORDER — BENZONATATE 200 MG PO CAPS: 200.0000 mg | ORAL_CAPSULE | Freq: Every day | ORAL | Status: DC

## 2015-04-11 NOTE — Discharge Instructions (Signed)

## 2015-04-11 NOTE — ED Provider Notes (Signed)
CSN: [id number]     Arrival date & time 04/11/15  1222 History   First MD Initiated Contact with Patient 04/11/15 1401     Chief Complaint  Patient presents with  . Cough     HPI Comments: About two weeks ago patient developed typical cold-like symptoms including mild sore throat, sinus congestion, headache, fatigue, and cough.  Over the past two to three days he has developed increased anterior chest congestion, and last night he developed chills.  His cough is non-productive, and he often coughs until he gags.  He does not remember his last Tdap  The history is provided by the patient.    History reviewed. No pertinent past medical history. Past Surgical History  Procedure Laterality Date  . Knee arthrocentesis    . Tonsillectomy  Age 59   . Arthroscopic repair acl    . Vasectomy     Family History  Problem Relation Age of Onset  . Diabetes Maternal Uncle   . Diabetes Maternal Aunt   . Heart attack Maternal Grandfather   . Heart attack Maternal Uncle    Social History  Substance Use Topics  . Smoking status: Never Smoker   . Smokeless tobacco: Never Used  . Alcohol Use: 0.5 oz/week    1 drink(s) per week    Review of Systems + sore throat + hoarse + cough No pleuritic pain No wheezing + nasal congestion + post-nasal drainage No sinus pain/pressure No itchy/red eyes No earache No hemoptysis No SOB No fever, + chills + nausea + vomiting, resolved No abdominal pain No diarrhea No urinary symptoms No skin rash + fatigue + myalgias + headache Used OTC meds without relief  Allergies  Review of patient's allergies indicates no known allergies.  Home Medications   Prior to Admission medications   Medication Sig Start Date End Date Taking? Authorizing Provider  ibuprofen (ADVIL,MOTRIN) 200 MG tablet Take three tablets ( 600 milligrams total) every 6 with food as needed for pain. 03/20/13   Lajean Manes, MD   Meds Ordered and Administered this Visit   Medications - No data to display  BP 119/72 mmHg  Pulse 69  Temp(Src) 98.4 F (36.9 C) (Oral)  Wt 155 lb (70.308 kg)  SpO2 99% No data found.   Physical Exam Nursing notes and Vital Signs reviewed. Appearance:  Patient appears stated age, and in no acute distress Eyes:  Pupils are equal, round, and reactive to light and accomodation.  Extraocular movement is intact.  Conjunctivae are not inflamed  Ears:  Canals normal.  Tympanic membranes normal.  Nose:  Mildly congested turbinates.  No sinus tenderness.    Pharynx:  Normal Neck:  Supple.  Slightly tender enlarged posterior nodes are palpated bilaterally  Lungs:  Clear to auscultation.  Breath sounds are equal.  Moving air well. Heart:  Regular rate and rhythm without murmurs, rubs, or gallops.  Abdomen:  Nontender without masses or hepatosplenomegaly.  Bowel sounds are present.  No CVA or flank tenderness.  Extremities:  No edema.  No calf tenderness Skin:  No rash present.   ED Course  Procedures none  MDM   1. Acute bronchitis, unspecified organism; ?pertussis   Begin Z-pak for atypical coverage, and prednisone burst. Prescription written for Benzonatate (Tessalon) to take at bedtime for night-time cough.  Take plain guaifenesin (  extended release tabs such as Mucinex) twice daily, with plenty of water, for cough and congestion.  May add Pseudoephedrine ( , one or two every 4 to  6 hours) for sinus congestion.  Get adequate rest.   May use Afrin nasal spray (or generic oxymetazoline) twice daily for about 5 days and then discontinue.  Also recommend using saline nasal spray several times daily and saline nasal irrigation (AYR is a common brand).  Use Flonase nasal spray each morning after using Afrin nasal spray and saline nasal irrigation. Try warm salt water gargles for sore throat.  Stop all antihistamines for now, and other non-prescription cough/cold preparations. Follow-up with family doctor if not improving  about 7 to10 days.    Lattie HawStephen A Jakerria Kingbird, MD 04/16/15 [phone]

## 2015-04-11 NOTE — ED Notes (Signed)
Pt c/o cough with mucous x2 weeks. C/o increase chest congestion. No OTC meds/

## 2015-06-09 ENCOUNTER — Other Ambulatory Visit: Payer: Self-pay | Admitting: Emergency Medicine

## 2015-06-09 ENCOUNTER — Emergency Department (INDEPENDENT_AMBULATORY_CARE_PROVIDER_SITE_OTHER)
Admission: EM | Admit: 2015-06-09 | Discharge: 2015-06-09 | Disposition: A | Discharge status: Home / Self Care | Payer: BC Managed Care – PPO | Source: Home / Self Care | Attending: Family Medicine | Admitting: Family Medicine

## 2015-06-09 DIAGNOSIS — R300 Dysuria: Secondary | ICD-10-CM

## 2015-06-09 DIAGNOSIS — N3001 Acute cystitis with hematuria: Secondary | ICD-10-CM | POA: Diagnosis not present

## 2015-06-09 LAB — POCT URINALYSIS DIP (MANUAL ENTRY)
Bilirubin, UA: NEGATIVE
Glucose, UA: NEGATIVE
Nitrite, UA: NEGATIVE
Protein Ur, POC: 100 — AB
Spec Grav, UA: >=1.03 (ref 1.005–1.03)
Urobilinogen, UA: 0.2 (ref 0–1)
pH, UA: 6 (ref 5–8)

## 2015-06-09 MED ORDER — CIPROFLOXACIN HCL 500 MG PO TABS: 500.0000 mg | ORAL_TABLET | Freq: Two times a day (BID) | ORAL | Status: DC

## 2015-06-09 NOTE — ED Notes (Signed)
Pt stated he has had urinary frequency last night and this morning.  Weak flow, and discharge at the end of urination, and slight pain with urination. Denies fever and chills.

## 2015-06-09 NOTE — Discharge Instructions (Signed)
Urinary Tract Infection Urinary tract infections (UTIs) can develop anywhere along your urinary tract. Your urinary tract is your body's drainage system for removing wastes and extra water. Your urinary tract includes two kidneys, two ureters, a bladder, and a urethra. Your kidneys are a pair of bean-shaped organs. Each kidney is about the size of your fist. They are located below your ribs, one on each side of your spine. CAUSES Infections are caused by microbes, which are microscopic organisms, including fungi, viruses, and bacteria. These organisms are so small that they can only be seen through a microscope. Bacteria are the microbes that most commonly cause UTIs. SYMPTOMS  Symptoms of UTIs may vary by age and gender of the patient and by the location of the infection. Symptoms in young women typically include a frequent and intense urge to urinate and a painful, burning feeling in the bladder or urethra during urination. Older women and men are more likely to be tired, shaky, and weak and have muscle aches and abdominal pain. A fever may mean the infection is in your kidneys. Other symptoms of a kidney infection include pain in your back or sides below the ribs, nausea, and vomiting. DIAGNOSIS To diagnose a UTI, your caregiver will ask you about your symptoms. Your caregiver will also ask you to provide a urine sample. The urine sample will be tested for bacteria and white blood cells. White blood cells are made by your body to help fight infection. TREATMENT  Typically, UTIs can be treated with medication. Because most UTIs are caused by a bacterial infection, they usually can be treated with the use of antibiotics. The choice of antibiotic and length of treatment depend on your symptoms and the type of bacteria causing your infection. HOME CARE INSTRUCTIONS  If you were prescribed antibiotics, take them exactly as your caregiver instructs you. Finish the medication even if you feel better after  you have only taken some of the medication.  Drink enough water and fluids to keep your urine clear or pale yellow.  Avoid caffeine, tea, and carbonated beverages. They tend to irritate your bladder.  Empty your bladder often. Avoid holding urine for long periods of time.  Empty your bladder before and after sexual intercourse.  After a bowel movement, women should cleanse from front to back. Use each tissue only once. SEEK MEDICAL CARE IF:   You have back pain.  You develop a fever.  Your symptoms do not begin to resolve within 3 days. SEEK IMMEDIATE MEDICAL CARE IF:   You have severe back pain or lower abdominal pain.  You develop chills.  You have nausea or vomiting.  You have continued burning or discomfort with urination. MAKE SURE YOU:   Understand these instructions.  Will watch your condition.  Will get help right away if you are not doing well or get worse.   This information is not intended to replace advice given to you by your health care provider. Make sure you discuss any questions you have with your health care provider.   Document Released: 01/13/2005 Document Revised: 12/25/2014 Document Reviewed: 05/14/2011 Elsevier Interactive Patient Education 2016 [employer] Inc.  Dysuria Dysuria is pain or discomfort while urinating. The pain or discomfort may be felt in the tube that carries urine out of the bladder (urethra) or in the surrounding tissue of the genitals. The pain may also be felt in the groin area, lower abdomen, and lower back. You may have to urinate frequently or have the sudden feeling  that you have to urinate (urgency). Dysuria can affect both men and women, but is more common in women. Dysuria can be caused by many different things, including:  Urinary tract infection in women.  Infection of the kidney or bladder.  Kidney stones or bladder stones.  Certain sexually transmitted infections (STIs), such as  chlamydia.  Dehydration.  Inflammation of the vagina.  Use of certain medicines.  Use of certain soaps or scented products that cause irritation. HOME CARE INSTRUCTIONS Watch your dysuria for any changes. The following actions may help to reduce any discomfort you are feeling:  Drink enough fluid to keep your urine clear or pale yellow.  Empty your bladder often. Avoid holding urine for long periods of time.  After a bowel movement or urination, women should cleanse from front to back, using each tissue only once.  Empty your bladder after sexual intercourse.  Take medicines only as directed by your health care provider.  If you were prescribed an antibiotic medicine, finish it all even if you start to feel better.  Avoid caffeine, tea, and alcohol. They can irritate the bladder and make dysuria worse. In men, alcohol may irritate the prostate.  Keep all follow-up visits as directed by your health care provider. This is important.  If you had any tests done to find the cause of dysuria, it is your responsibility to obtain your test results. Ask the lab or department performing the test when and how you will get your results. Talk with your health care provider if you have any questions about your results. SEEK MEDICAL CARE IF:  You develop pain in your back or sides.  You have a fever.  You have nausea or vomiting.  You have blood in your urine.  You are not urinating as often as you usually do. SEEK IMMEDIATE MEDICAL CARE IF:  You pain is severe and not relieved with medicines.  You are unable to hold down any fluids.  You or someone else notices a change in your mental function.  You have a rapid heartbeat at rest.  You have shaking or chills.  You feel extremely weak.   This information is not intended to replace advice given to you by your health care provider. Make sure you discuss any questions you have with your health care provider.   Document Released:  01/02/2004 Document Revised: 04/26/2014 Document Reviewed: 11/29/2013 Elsevier Interactive Patient Education [employer]

## 2015-06-09 NOTE — ED Provider Notes (Signed)
CSN: [id number]     Arrival date & time 06/09/15  1558 History   First MD Initiated Contact with Patient 06/09/15 1630     Chief Complaint  Patient presents with  . Urinary Frequency   (Consider location/radiation/quality/duration/timing/severity/associated sxs/prior Treatment) HPI  The pt is a 60yo male presenting to [facility] with c/o 1 day of urinary frequency, urgency and pain.  He also noticed a weak flow and small amount of white discharge at the end of urination.  Denies fever, chills, n/v/d. Denies lower abdominal pain or back pain. Denies fever, chills, n/v/d. Denies rashes or lesions. Denies concern for STDs. Reports hx of kidney stones but states this feels different.   History reviewed. No pertinent past medical history. Past Surgical History  Procedure Laterality Date  . Knee arthrocentesis    . Tonsillectomy  Age 47   . Arthroscopic repair acl    . Vasectomy     Family History  Problem Relation Age of Onset  . Diabetes Maternal Uncle   . Diabetes Maternal Aunt   . Heart attack Maternal Grandfather   . Heart attack Maternal Uncle    Social History  Substance Use Topics  . Smoking status: Never Smoker   . Smokeless tobacco: Never Used  . Alcohol Use: 0.5 oz/week    1 drink(s) per week    Review of Systems  Constitutional: Negative for fever and chills.  Gastrointestinal: Negative for nausea, vomiting, abdominal pain and diarrhea.  Genitourinary: Positive for dysuria, urgency, frequency, decreased urine volume and discharge. Negative for hematuria, flank pain, penile swelling, scrotal swelling, penile pain and testicular pain.  Musculoskeletal: Negative for myalgias and back pain.    Allergies  Review of patient's allergies indicates no known allergies.  Home Medications   Prior to Admission medications   Medication Sig Start Date End Date Taking? Authorizing Provider  azithromycin (ZITHROMAX Z-PAK) 250 MG tablet Take 2 tabs today; then begin one tab once daily for  4 more days. 04/11/15   Lattie Haw, MD  benzonatate (TESSALON) 200 MG capsule Take 1 capsule (200 mg total) by mouth at bedtime. Take as needed for cough 04/11/15   Lattie Haw, MD  ciprofloxacin (CIPRO) 500 MG tablet Take 1 tablet (500 mg total) by mouth 2 (two) times daily. One po bid x 7 days 06/09/15   Junius Finner, PA-C  ibuprofen (ADVIL,MOTRIN) 200 MG tablet Take three tablets ( 600 milligrams total) every 6 with food as needed for pain. 03/20/13   Lajean Manes, MD  predniSONE (DELTASONE) 20 MG tablet Take 1 tablet (20 mg total) by mouth 2 (two) times daily. Take with food. 04/11/15   Lattie Haw, MD   Meds Ordered and Administered this Visit  Medications - No data to display  BP 120/77 mmHg  Pulse 71  Temp(Src) 98.1 F (36.7 C) (Oral)  Ht  (1.727 m)  Wt 160 lb 8 oz (72.802 kg)  BMI 24.41 kg/m2  SpO2 98% No data found.   Physical Exam  Constitutional: He is oriented to person, place, and time. He appears well-developed and well-nourished.  HENT:  Head: Normocephalic and atraumatic.  Eyes: EOM are normal.  Neck: Normal range of motion.  Cardiovascular: Normal rate, regular rhythm and normal heart sounds.   Pulmonary/Chest: Effort normal and breath sounds normal. No respiratory distress.  Abdominal: Soft. He exhibits no distension. There is no tenderness. There is no CVA tenderness.  Musculoskeletal: Normal range of motion.  Neurological: He is alert and  oriented to person, place, and time.  Skin: Skin is warm and dry.  Psychiatric: He has a normal mood and affect. His behavior is normal.  Nursing note and vitals reviewed.   ED Course  Procedures (including critical care time)  Labs Review Labs Reviewed  POCT URINALYSIS DIP (MANUAL ENTRY) - Abnormal; Notable for the following:    Ketones, POC UA trace (5) (*)    Blood, UA moderate (*)    Protein Ur, POC =100 (*)    Leukocytes, UA small (1+) (*)    All other components within normal limits  URINE  CULTURE  GC/CHLAMYDIA PROBE AMP, URINE    Imaging Review No results found.    MDM   1. Dysuria   2. Acute cystitis with hematuria     Pt c/o urinary symptoms with small amount of penile discharge that started last night. Hx of similar symptoms. Denies concern for STDs.  UA: moderate blood, protein and leukocytes  Will tx for UTI/prostatitis/urethritis  GC/Chlamydia and urine culture sent  Rx: Cipro Encouraged to f/u with PCP in 1 week if not improving. May need referral to urology if symptoms persist or are recurrent.  Patient verbalized understanding and agreement with treatment plan.     Junius Finner, PA-C 06/09/15 1914

## 2015-06-10 LAB — GC/CHLAMYDIA PROBE AMP
CT Probe RNA: NOT DETECTED
GC Probe RNA: NOT DETECTED

## 2015-06-11 LAB — URINE CULTURE
Colony Count: NO GROWTH
Organism ID, Bacteria: NO GROWTH

## 2015-06-12 ENCOUNTER — Telehealth: Payer: Self-pay | Admitting: *Deleted

## 2016-03-10 ENCOUNTER — Other Ambulatory Visit: Payer: Self-pay

## 2016-03-10 ENCOUNTER — Telehealth: Payer: Self-pay | Admitting: Family Medicine

## 2016-03-10 ENCOUNTER — Encounter: Payer: Self-pay | Admitting: Family Medicine

## 2016-03-10 ENCOUNTER — Ambulatory Visit (HOSPITAL_BASED_OUTPATIENT_CLINIC_OR_DEPARTMENT_OTHER)
Admission: RE | Admit: 2016-03-10 | Discharge: 2016-03-10 | Disposition: A | Discharge status: Home / Self Care | Payer: BC Managed Care – PPO | Source: Ambulatory Visit | Attending: Family Medicine | Admitting: Family Medicine

## 2016-03-10 ENCOUNTER — Ambulatory Visit (INDEPENDENT_AMBULATORY_CARE_PROVIDER_SITE_OTHER): Payer: BC Managed Care – PPO | Admitting: Family Medicine

## 2016-03-10 VITALS — BP 111/63 | HR 69 | Ht 68.0 in | Wt 162.0 lb

## 2016-03-10 DIAGNOSIS — Z13220 Encounter for screening for lipoid disorders: Secondary | ICD-10-CM

## 2016-03-10 DIAGNOSIS — N400 Enlarged prostate without lower urinary tract symptoms: Secondary | ICD-10-CM

## 2016-03-10 DIAGNOSIS — R361 Hematospermia: Secondary | ICD-10-CM

## 2016-03-10 DIAGNOSIS — N50811 Right testicular pain: Secondary | ICD-10-CM

## 2016-03-10 DIAGNOSIS — I861 Scrotal varices: Secondary | ICD-10-CM | POA: Insufficient documentation

## 2016-03-10 DIAGNOSIS — N433 Hydrocele, unspecified: Secondary | ICD-10-CM | POA: Diagnosis not present

## 2016-03-10 DIAGNOSIS — N5089 Other specified disorders of the male genital organs: Secondary | ICD-10-CM | POA: Insufficient documentation

## 2016-03-10 LAB — POCT URINALYSIS DIPSTICK
BILIRUBIN UA: NEGATIVE
GLUCOSE UA: NEGATIVE
Ketones, UA: NEGATIVE
LEUKOCYTES UA: NEGATIVE
NITRITE UA: NEGATIVE
Protein, UA: NEGATIVE
Spec Grav, UA: 1.015
Urobilinogen, UA: 0.2
pH, UA: 6

## 2016-03-10 MED ORDER — CIPROFLOXACIN HCL 500 MG PO TABS: 500.0000 mg | ORAL_TABLET | Freq: Two times a day (BID) | ORAL | 0 refills | Status: DC

## 2016-03-10 NOTE — Progress Notes (Signed)
Subjective:    Patient ID: Jake Pearson, male    DOB: Aug 22, 1955, [age] y.o.   MRN: [id number]  HPI 9158 you male comes in today c/o urine.  pt reports that his sxs began 2 wks ago. he inforomed me that he has has pain in his testilcles on/off for years. the last time the he noticed blood in his urine was last weekend. denies fever,chills,back or flank pain.  He's not had any injury or pain per se in the abdomen pelvis or scrotal area recently. No prior history of pelvic or testicular surgeries. He has never been a smoker. No family history of prostate or bladder problems. He did have a vasectomy 8-10 years ago. He denies any dysuria. No recent change in bowel movements. [facility]   Review of Systems   BP 111/63   Pulse 69   Ht 5\' 8"  (1.727 m)   Wt 162 lb (73.5 kg)   SpO2 100%   BMI 24.63 kg/m     No Known Allergies  No past medical history on file.  Past Surgical History:  Procedure Laterality Date  . ARTHROSCOPIC REPAIR ACL    . KNEE ARTHROCENTESIS    . TONSILLECTOMY  Age 89   . VASECTOMY      Social History   Social History  . Marital status: Married    Spouse name: Aram BeechamCynthia  . Number of children: 2  . Years of education: N/A   Occupational History  .  [employer]   Social History Main Topics  . Smoking status: Never Smoker  . Smokeless tobacco: Never Used  . Alcohol use 0.5 oz/week    1 drink(s) per week  . Drug use: No  . Sexual activity: Yes    Partners: Female   Other Topics Concern  . 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  Has 2 kids.     Family History  Problem Relation Age of Onset  . Diabetes Maternal Uncle   . Diabetes Maternal Aunt   . Heart attack Maternal Grandfather   . Heart attack Maternal Uncle     Outpatient Encounter Prescriptions as of 03/10/2016  Medication Sig  . ciprofloxacin (CIPRO) 500 MG tablet Take 1 tablet (500 mg total) by mouth 2  (two) times daily.  . [DISCONTINUED] azithromycin (ZITHROMAX Z-PAK) 250 MG tablet Take 2 tabs today; then begin one tab once daily for 4 more days.  . [DISCONTINUED] benzonatate (TESSALON) 200 MG capsule Take 1 capsule (200 mg total) by mouth at bedtime. Take as needed for cough  . [DISCONTINUED] ciprofloxacin (CIPRO) 500 MG tablet Take 1 tablet (500 mg total) by mouth 2 (two) times daily. One po bid x 7 days  . [DISCONTINUED] ibuprofen (ADVIL,MOTRIN) 200 MG tablet Take three tablets ( 600 milligrams total) every 6 with food as needed for pain.  . [DISCONTINUED] predniSONE (DELTASONE) 20 MG tablet Take 1 tablet (20 mg total) by mouth 2 (two) times daily. Take with food.   No facility-administered encounter medications on file as of 03/10/2016.           Objective:   Physical Exam  Constitutional: He is oriented to person, place, and time. He appears well-developed and well-nourished.  HENT:  Head: Normocephalic and atraumatic.  Abdominal: Hernia confirmed negative in the right inguinal area and confirmed negative in the left inguinal area.  Genitourinary: Rectal exam shows  no external hemorrhoid, no internal hemorrhoid, no fissure, no mass, no tenderness, anal tone normal and guaiac negative stool. Prostate is enlarged. Right testis shows no mass, no swelling and no tenderness. Right testis is descended. Cremasteric reflex is not absent on the right side. Left testis shows mass. Left testis shows no swelling and no tenderness. Left testis is descended. Cremasteric reflex is not absent on the left side.  Genitourinary Comments: Prostate 2+ enlarged, symmetric. No nodules no tenderness or bogginess.  He does have a swollen area at the proximal end of the testicle most likely a varicocele.  Lymphadenopathy:       Right: No inguinal adenopathy present.       Left: No inguinal adenopathy present.  Neurological: He is alert and oriented to person, place, and time.  Skin: Skin is warm and dry.   Psychiatric: He has a normal mood and affect. His behavior is normal.          Assessment & Plan:  Blood in semen - Unclear etiology. Did reassure him that in most patients if this is completely benign. We are do some additional workup including a PSA test as he has not had one of those done since turning 52. We'll also get him set up for scrotal ultrasound since I did fill an abnormal on the left testicle. More consistent with a varicocele versus a firm mass. Ambien and go ahead and treat him with Cipro for 2 weeks for possible prostatitis though no tenderness of the gland itself. If symptoms resolve then great if not then we'll continue with further workup.

## 2016-03-10 NOTE — Telephone Encounter (Signed)
Left VM for Pt to see if he went to a different office for colonoscopy.

## 2016-03-10 NOTE — Telephone Encounter (Signed)
Called [facility], was advised they had the Pt scheduled for a colonoscopy on 01/31/2008 but he did not show up.

## 2016-03-10 NOTE — Telephone Encounter (Signed)
Please call [facility]. Patient says he had a colonoscopy done there about 10 years ago.

## 2016-03-15 ENCOUNTER — Other Ambulatory Visit: Payer: Self-pay | Admitting: Family Medicine

## 2016-03-15 MED ORDER — LEVOFLOXACIN 500 MG PO TABS: 500.0000 mg | ORAL_TABLET | Freq: Two times a day (BID) | ORAL | 0 refills | Status: AC

## 2016-07-13 ENCOUNTER — Emergency Department
Admission: EM | Admit: 2016-07-13 | Discharge: 2016-07-13 | Disposition: A | Discharge status: Home / Self Care | Payer: BC Managed Care – PPO | Source: Home / Self Care | Attending: Family Medicine | Admitting: Family Medicine

## 2016-07-13 ENCOUNTER — Encounter: Payer: Self-pay | Admitting: *Deleted

## 2016-07-13 DIAGNOSIS — R300 Dysuria: Secondary | ICD-10-CM | POA: Diagnosis not present

## 2016-07-13 LAB — POCT URINALYSIS DIP (MANUAL ENTRY)
BILIRUBIN UA: NEGATIVE
Glucose, UA: NEGATIVE
Ketones, POC UA: NEGATIVE
Nitrite, UA: NEGATIVE
PH UA: 5.5 (ref 5.0–8.0)
UROBILINOGEN UA: 0.2 (ref ?–2.0)

## 2016-07-13 MED ORDER — CIPROFLOXACIN HCL 500 MG PO TABS: 500.0000 mg | ORAL_TABLET | Freq: Two times a day (BID) | ORAL | 0 refills | Status: DC

## 2016-07-13 NOTE — ED Provider Notes (Signed)
[facility]    CSN: [id number] Arrival date & time: 07/13/16  1632     History   Chief Complaint Chief Complaint  Patient presents with  . Dysuria    HPI Blenda NicelyWilliam Polasek is a 61 y.o. male.   Patient complains of three day history of dysuria, frequency, mild urethral discharge, and soreness in his left testicle.  No abdominal pain.  No fevers, chills, and sweats.  He had a similar episode last year that improved with antibiotic.  He denies concern for STD.   The history is provided by the patient.  Dysuria  This is a recurrent problem. The current episode started more than 2 days ago. The problem occurs constantly. The problem has been gradually worsening. Pertinent negatives include no abdominal pain. Nothing aggravates the symptoms. Nothing relieves the symptoms. He has tried nothing for the symptoms.    History reviewed. No pertinent past medical history.  There are no active problems to display for this patient.   Past Surgical History:  Procedure Laterality Date  . ARTHROSCOPIC REPAIR ACL    . KNEE ARTHROCENTESIS    . TONSILLECTOMY  Age 89   . VASECTOMY         Home Medications    Prior to Admission medications   Medication Sig Start Date End Date Taking? Authorizing Provider  ciprofloxacin (CIPRO) 500 MG tablet Take 1 tablet (500 mg total) by mouth 2 (two) times daily. 07/13/16   Lattie HawStephen A Decklyn Hyder, MD    Family History Family History  Problem Relation Age of Onset  . Diabetes Maternal Uncle   . Diabetes Maternal Aunt   . Heart attack Maternal Grandfather   . Heart attack Maternal Uncle     Social History Social History  Substance Use Topics  . Smoking status: Never Smoker  . Smokeless tobacco: Never Used  . Alcohol use 0.5 oz/week    1 drink(s) per week     Allergies   Patient has no known allergies.   Review of Systems Review of Systems  Gastrointestinal: Negative for abdominal pain.  Genitourinary: Positive for discharge,  dysuria, frequency, testicular pain and urgency. Negative for difficulty urinating, flank pain, genital sores, hematuria, penile pain and scrotal swelling.  All other systems reviewed and are negative.    Physical Exam Triage Vital Signs ED Triage Vitals  Enc Vitals Group     BP 07/13/16 1647 119/82     Pulse Rate 07/13/16 1647 72     Resp 07/13/16 1647 16     Temp 07/13/16 1647 98.1 F (36.7 C)     Temp Source 07/13/16 1647 Oral     SpO2 07/13/16 1647 98 %     Weight 07/13/16 1648 165 lb (74.8 kg)     Height 07/13/16 1648 5\' 8"  (1.727 m)     Head Circumference --      Peak Flow --      Pain Score 07/13/16 1648 0     Pain Loc --      Pain Edu? --      Excl. in GC? --    No data found.   Updated Vital Signs BP 119/82 (BP Location: Left Arm)   Pulse 72   Temp 98.1 F (36.7 C) (Oral)   Resp 16   Ht 5\' 8"  (1.727 m)   Wt 165 lb (74.8 kg)   SpO2 98%   BMI 25.09 kg/m   Visual Acuity Right Eye Distance:   Left Eye Distance:  Bilateral Distance:    Right Eye Near:   Left Eye Near:    Bilateral Near:     Physical Exam Nursing notes and Vital Signs reviewed. Appearance:  Patient appears stated age, and in no acute distress.    Eyes:  Pupils are equal, round, and reactive to light and accomodation.  Extraocular movement is intact.  Conjunctivae are not inflamed   Pharynx:  Normal; moist mucous membranes  Neck:  Supple.  No adenopathy Lungs:  Clear to auscultation.  Breath sounds are equal.  Moving air well. Heart:  Regular rate and rhythm without murmurs, rubs, or gallops.  Abdomen:  Nontender without masses or hepatosplenomegaly.  Bowel sounds are present.  No CVA or flank tenderness.  Extremities:  No edema.  Skin:  No rash present.     UC Treatments / Results  Labs (all labs ordered are listed, but only abnormal results are displayed) Labs Reviewed  POCT URINALYSIS DIP (MANUAL ENTRY) - Abnormal; Notable for the following:       Result Value   Clarity, UA  cloudy (*)    Spec Grav, UA >1.035 (*)    Blood, UA small (*)    Protein Ur, POC =100 (*)    Leukocytes, UA Trace (*)    All other components within normal limits  URINE CULTURE    EKG  EKG Interpretation None       Radiology No results found.  Procedures Procedures (including critical care time)  Medications Ordered in UC Medications - No data to display   Initial Impression / Assessment and Plan / UC Course  I have reviewed the triage vital signs and the nursing notes.  Pertinent labs & imaging results that were available during my care of the patient were reviewed by me and considered in my medical decision making (see chart for details).     Urine cutlure pending.  Begin Cipro 500mg  BID for one week. Increase fluid intake. Followup with urologist if not resolved one week.   Final Clinical Impressions(s) / UC Diagnoses   Final diagnoses:  Dysuria    New Prescriptions New Prescriptions   CIPROFLOXACIN (CIPRO) 500 MG TABLET    Take 1 tablet (500 mg total) by mouth 2 (two) times daily.     Lattie Haw, MD 07/26/16 [phone]

## 2016-07-13 NOTE — Discharge Instructions (Signed)
Increase fluid intake.

## 2016-07-13 NOTE — ED Triage Notes (Signed)
Pt c/o dysuria and yellow/green d/c in his urine x 3 days. Denies concern for STD. Denies fever or back pain.

## 2016-07-16 LAB — URINE CULTURE

## 2016-07-19 ENCOUNTER — Telehealth: Payer: Self-pay

## 2016-07-19 NOTE — Telephone Encounter (Signed)
Left VM regarding results and treatment.

## 2016-11-17 ENCOUNTER — Ambulatory Visit (INDEPENDENT_AMBULATORY_CARE_PROVIDER_SITE_OTHER): Payer: BC Managed Care – PPO | Admitting: Physician Assistant

## 2016-11-17 ENCOUNTER — Encounter: Payer: Self-pay | Admitting: Physician Assistant

## 2016-11-17 ENCOUNTER — Telehealth: Payer: Self-pay | Admitting: Family Medicine

## 2016-11-17 ENCOUNTER — Ambulatory Visit (INDEPENDENT_AMBULATORY_CARE_PROVIDER_SITE_OTHER): Payer: BC Managed Care – PPO

## 2016-11-17 VITALS — BP 118/77 | HR 62 | Temp 97.9°F | Ht 68.0 in | Wt 171.1 lb

## 2016-11-17 DIAGNOSIS — H9192 Unspecified hearing loss, left ear: Secondary | ICD-10-CM

## 2016-11-17 DIAGNOSIS — R052 Subacute cough: Secondary | ICD-10-CM

## 2016-11-17 DIAGNOSIS — J019 Acute sinusitis, unspecified: Secondary | ICD-10-CM

## 2016-11-17 DIAGNOSIS — R05 Cough: Secondary | ICD-10-CM

## 2016-11-17 DIAGNOSIS — R059 Cough, unspecified: Secondary | ICD-10-CM | POA: Insufficient documentation

## 2016-11-17 MED ORDER — FLUTICASONE PROPIONATE 50 MCG/ACT NA SUSP: 2.0000 | Freq: Every day | NASAL | 6 refills | Status: DC

## 2016-11-17 MED ORDER — AZITHROMYCIN 250 MG PO TABS: ORAL_TABLET | ORAL | 0 refills | Status: DC

## 2016-11-17 MED ORDER — CETIRIZINE HCL 10 MG PO TABS: 10.0000 mg | ORAL_TABLET | Freq: Every day | ORAL | 11 refills | Status: DC

## 2016-11-17 MED ORDER — PREDNISONE 50 MG PO TABS: ORAL_TABLET | ORAL | 0 refills | Status: DC

## 2016-11-17 NOTE — Patient Instructions (Signed)
- Chest x-ray today - Take antibiotic and steroid for 5 days as prescribed - Start flonase and zyrtec daily and stay on it until fall allergy season has passed  To use your nasal spray: - blow your nose - tilt head back - block one nostril - aim away from your nasal septum (toward your eye) - dispense and gently inhale (DO NOT SNIFF) - wait 5 seconds - repeat on other side - wait at least 15 minutes to blow your nose again    Sinusitis, Adult Sinusitis is soreness and inflammation of your sinuses. Sinuses are hollow spaces in the bones around your face. Your sinuses are located:  Around your eyes.  In the middle of your forehead.  Behind your nose.  In your cheekbones.  Your sinuses and nasal passages are lined with a stringy fluid (mucus). Mucus normally drains out of your sinuses. When your nasal tissues become inflamed or swollen, the mucus can become trapped or blocked so air cannot flow through your sinuses. This allows bacteria, viruses, and funguses to grow, which leads to infection. Sinusitis can develop quickly and last for 7?10 days (acute) or for more than 12 weeks (chronic). Sinusitis often develops after a cold. What are the causes? This condition is caused by anything that creates swelling in the sinuses or stops mucus from draining, including:  Allergies.  Asthma.  Bacterial or viral infection.  Abnormally shaped bones between the nasal passages.  Nasal growths that contain mucus (nasal polyps).  Narrow sinus openings.  Pollutants, such as chemicals or irritants in the air.  A foreign object stuck in the nose.  A fungal infection. This is rare.  What increases the risk? The following factors may make you more likely to develop this condition:  Having allergies or asthma.  Having had a recent cold or respiratory tract infection.  Having structural deformities or blockages in your nose or sinuses.  Having a weak immune system.  Doing a lot of  swimming or diving.  Overusing nasal sprays.  Smoking.  What are the signs or symptoms? The main symptoms of this condition are pain and a feeling of pressure around the affected sinuses. Other symptoms include:  Upper toothache.  Earache.  Headache.  Bad breath.  Decreased sense of smell and taste.  A cough that may get worse at night.  Fatigue.  Fever.  Thick drainage from your nose. The drainage is often green and it may contain pus (purulent).  Stuffy nose or congestion.  Postnasal drip. This is when extra mucus collects in the throat or back of the nose.  Swelling and warmth over the affected sinuses.  Sore throat.  Sensitivity to light.  How is this diagnosed? This condition is diagnosed based on symptoms, a medical history, and a physical exam. To find out if your condition is acute or chronic, your health care provider may:  Look in your nose for signs of nasal polyps.  Tap over the affected sinus to check for signs of infection.  View the inside of your sinuses using an imaging device that has a light attached (endoscope).  If your health care provider suspects that you have chronic sinusitis, you may also:  Be tested for allergies.  Have a sample of mucus taken from your nose (nasal culture) and checked for bacteria.  Have a mucus sample examined to see if your sinusitis is related to an allergy.  If your sinusitis does not respond to treatment and it lasts longer than 8  weeks, you may have an MRI or CT scan to check your sinuses. These scans also help to determine how severe your infection is. In rare cases, a bone biopsy may be done to rule out more serious types of fungal sinus disease. How is this treated? Treatment for sinusitis depends on the cause and whether your condition is chronic or acute. If a virus is causing your sinusitis, your symptoms will go away on their own within 10 days. You may be given medicines to relieve your symptoms,  including:  Topical nasal decongestants. They shrink swollen nasal passages and let mucus drain from your sinuses.  Antihistamines. These drugs block inflammation that is triggered by allergies. This can help to ease swelling in your nose and sinuses.  Topical nasal corticosteroids. These are nasal sprays that ease inflammation and swelling in your nose and sinuses.  Nasal saline washes. These rinses can help to get rid of thick mucus in your nose.  If your condition is caused by bacteria, you will be given an antibiotic medicine. If your condition is caused by a fungus, you will be given an antifungal medicine. Surgery may be needed to correct underlying conditions, such as narrow nasal passages. Surgery may also be needed to remove polyps. Follow these instructions at home: Medicines  Take, use, or apply over-the-counter and prescription medicines only as told by your health care provider. These may include nasal sprays.  If you were prescribed an antibiotic medicine, take it as told by your health care provider. Do not stop taking the antibiotic even if you start to feel better. Hydrate and Humidify  Drink enough water to keep your urine clear or pale yellow. Staying hydrated will help to thin your mucus.  Use a cool mist humidifier to keep the humidity level in your home above 50%.  Inhale steam for 10-15 minutes, 3-4 times a day or as told by your health care provider. You can do this in the bathroom while a hot shower is running.  Limit your exposure to cool or dry air. Rest  Rest as much as possible.  Sleep with your head raised (elevated).  Make sure to get enough sleep each night. General instructions  Apply a warm, moist washcloth to your face 3-4 times a day or as told by your health care provider. This will help with discomfort.  Wash your hands often with soap and water to reduce your exposure to viruses and other germs. If soap and water are not available, use hand  sanitizer.  Do not smoke. Avoid being around people who are smoking (secondhand smoke).  Keep all follow-up visits as told by your health care provider. This is important. Contact a health care provider if:  You have a fever.  Your symptoms get worse.  Your symptoms do not improve within 10 days. Get help right away if:  You have a severe headache.  You have persistent vomiting.  You have pain or swelling around your face or eyes.  You have vision problems.  You develop confusion.  Your neck is stiff.  You have trouble breathing. This information is not intended to replace advice given to you by your health care provider. Make sure you discuss any questions you have with your health care provider. Document Released: 04/05/2005 Document Revised: 11/30/2015 Document Reviewed: 01/29/2015 Elsevier Interactive Patient Education  2017 [employer]

## 2016-11-17 NOTE — Telephone Encounter (Signed)
Faxed to lab.

## 2016-11-17 NOTE — Telephone Encounter (Signed)
Will fax over order for Cologuard.

## 2016-11-17 NOTE — Progress Notes (Signed)
HPI:                                                                Jake Pearson is a 61 y.o. male [facility] today for sinus congestion  Sinus Problem  This is a new problem. The current episode started more than 1 month ago. The problem is unchanged. There has been no fever. Associated symptoms include congestion, coughing, headaches, shortness of breath and sinus pressure. Pertinent negatives include no chills, ear pain, hoarse voice, neck pain or sore throat. Past treatments include nothing.     Past Medical History:  Diagnosis Date  . Enlarged prostate   . History of nephrolithiasis    Past Surgical History:  Procedure Laterality Date  . ARTHROSCOPIC REPAIR ACL    . KNEE ARTHROCENTESIS    . TONSILLECTOMY  Age 61   . VASECTOMY     Social History  Substance Use Topics  . Smoking status: Never Smoker  . Smokeless tobacco: Never Used  . Alcohol use 0.5 oz/week    1 drink(s) per week   family history includes Diabetes in his maternal aunt and maternal uncle; Heart attack in his maternal grandfather and maternal uncle.  ROS: Review of Systems  Constitutional: Negative for chills.  HENT: Positive for congestion, hearing loss (left, acute on chronic) and sinus pressure. Negative for ear discharge, ear pain, hoarse voice and sore throat.   Respiratory: Positive for cough, sputum production and shortness of breath. Negative for hemoptysis and wheezing.   Cardiovascular: Negative.   Gastrointestinal: Negative.   Musculoskeletal: Negative for neck pain.  Skin: Negative.   Neurological: Positive for headaches.    Medications: No current outpatient prescriptions on file.   No current facility-administered medications for this visit.    No Known Allergies     Objective:  BP 118/77   Pulse 62   Temp 97.9 F (36.6 C)   Ht 5\' 8"  (1.727 m)   Wt 171 lb 1.6 oz (77.6 kg)   SpO2 98%   BMI 26.02 kg/m  Gen:  well-groomed, cooperative, not ill-appearing, no distress HEENT: normal conjunctiva, wearing glasses, right ear canal and TM clear, left TM bulging, nasal mucosa edematous, oropharynx clear, moist mucus membranes, no frontal or maxillary sinus tenderness, neck supple, trachea midline Pulm: Normal work of breathing, normal phonation, clear to auscultation bilaterally, no wheezes, rales or rhonchi CV: Normal rate, regular rhythm, s1 and s2 distinct, no murmurs, clicks or rubs  Neuro: alert and oriented x 3, no tremor MSK: extremities atraumatic, normal gait and station Lymph: no cervical or tonsillar adenopathy Skin: warm, dry, intact; no rashes on exposed skin, no cyanosis   No results found for this or any previous visit (from the past 72 hour(s)). No results found.    Assessment and Plan: 61 y.o. male with   1. Acute non-recurrent sinusitis, unspecified location - fluticasone (FLONASE) 50 MCG/ACT nasal spray; Place 2 sprays into both nostrils daily.  Dispense: 16 g; Refill: 6 - cetirizine (ZYRTEC) 10 MG tablet; Take 1 tablet (10 mg total) by mouth daily.  Dispense: 30 tablet; Refill: 11 - predniSONE (DELTASONE) 50 MG tablet; One tab PO daily for 5 days.  Dispense: 5 tablet; Refill: 0 -  azithromycin (ZITHROMAX Z-PAK) 250 MG tablet; Take 2 tablets (500 mg) on  Day 1,  followed by 1 tablet (250 mg) once daily on Days 2 through 5.  Dispense: 6 tablet; Refill: 0  2. Subacute cough - DG Chest 2 View; Future  Patient education and anticipatory guidance given Patient agrees with treatment plan Follow-up in 1 month with PCP for CPE or sooner as needed if symptoms worsen or fail to improve  [name] PA-C

## 2016-11-17 NOTE — Telephone Encounter (Signed)
Pt would like to get cologuard because he is due. Thanks

## 2016-12-05 LAB — COLOGUARD: Cologuard: NEGATIVE

## 2016-12-07 ENCOUNTER — Encounter: Payer: BC Managed Care – PPO | Admitting: Family Medicine

## 2016-12-16 ENCOUNTER — Ambulatory Visit (INDEPENDENT_AMBULATORY_CARE_PROVIDER_SITE_OTHER): Payer: BC Managed Care – PPO | Admitting: Family Medicine

## 2016-12-16 ENCOUNTER — Encounter: Payer: Self-pay | Admitting: Family Medicine

## 2016-12-16 VITALS — BP 123/70 | HR 72 | Wt 172.0 lb

## 2016-12-16 DIAGNOSIS — R252 Cramp and spasm: Secondary | ICD-10-CM | POA: Diagnosis not present

## 2016-12-16 DIAGNOSIS — N400 Enlarged prostate without lower urinary tract symptoms: Secondary | ICD-10-CM | POA: Diagnosis not present

## 2016-12-16 DIAGNOSIS — J329 Chronic sinusitis, unspecified: Secondary | ICD-10-CM

## 2016-12-16 DIAGNOSIS — R350 Frequency of micturition: Secondary | ICD-10-CM

## 2016-12-16 DIAGNOSIS — Z0000 Encounter for general adult medical examination without abnormal findings: Secondary | ICD-10-CM

## 2016-12-16 NOTE — Patient Instructions (Addendum)
Keep up a regular exercise program and make sure you are eating a healthy diet Try to eat 4 servings of dairy a day, or if you are lactose intolerant take a calcium with vitamin D daily.     Health Maintenance, Male A healthy lifestyle and preventive care is important for your health and wellness. Ask your health care provider about what schedule of regular examinations is right for you. What should I know about weight and diet? Eat a Healthy Diet  Eat plenty of vegetables, fruits, whole grains, low-fat dairy products, and lean protein.  Do not eat a lot of foods high in solid fats, added sugars, or salt.  Maintain a Healthy Weight Regular exercise can help you achieve or maintain a healthy weight. You should:  Do at least 150 minutes of exercise each week. The exercise should increase your heart rate and make you sweat (moderate-intensity exercise).  Do strength-training exercises at least twice a week.  Watch Your Levels of Cholesterol and Blood Lipids  Have your blood tested for lipids and cholesterol every 5 years starting at 59 years of age. If you are at high risk for heart disease, you should start having your blood tested when you are 59 years old. You may need to have your cholesterol levels checked more often if: ? Your lipid or cholesterol levels are high. ? You are older than 59 years of age. ? You are at high risk for heart disease.  What should I know about cancer screening? Many types of cancers can be detected early and may often be prevented. Lung Cancer  You should be screened every year for lung cancer if: ? You are a current smoker who has smoked for at least 30 years. ? You are a former smoker who has quit within the past 15 years.  Talk to your health care provider about your screening options, when you should start screening, and how often you should be screened.  Colorectal Cancer  Routine colorectal cancer screening usually begins at 59 years of age and  should be repeated every 5-10 years until you are 59 years old. You may need to be screened more often if early forms of precancerous polyps or small growths are found. Your health care provider may recommend screening at an earlier age if you have risk factors for colon cancer.  Your health care provider may recommend using home test kits to check for hidden blood in the stool.  A small camera at the end of a tube can be used to examine your colon (sigmoidoscopy or colonoscopy). This checks for the earliest forms of colorectal cancer.  Prostate and Testicular Cancer  Depending on your age and overall health, your health care provider may do certain tests to screen for prostate and testicular cancer.  Talk to your health care provider about any symptoms or concerns you have about testicular or prostate cancer.  Skin Cancer  Check your skin from head to toe regularly.  Tell your health care provider about any new moles or changes in moles, especially if: ? There is a change in a mole's size, shape, or color. ? You have a mole that is larger than a pencil eraser.  Always use sunscreen. Apply sunscreen liberally and repeat throughout the day.  Protect yourself by wearing long sleeves, pants, a wide-brimmed hat, and sunglasses when outside.  What should I know about heart disease, diabetes, and high blood pressure?  If you are 2818-59 years of age, have your  blood pressure checked every 3-5 years. If you are 61 years of age or older, have your blood pressure checked every year. You should have your blood pressure measured twice-once when you are at a hospital or clinic, and once when you are not at a hospital or clinic. Record the average of the two measurements. To check your blood pressure when you are not at a hospital or clinic, you can use: ? An automated blood pressure machine at a pharmacy. ? A home blood pressure monitor.  Talk to your health care provider about your target blood  pressure.  If you are between 34-84 years old, ask your health care provider if you should take aspirin to prevent heart disease.  Have regular diabetes screenings by checking your fasting blood sugar level. ? If you are at a normal weight and have a low risk for diabetes, have this test once every three years after the age of 29. ? If you are overweight and have a high risk for diabetes, consider being tested at a younger age or more often.  A one-time screening for abdominal aortic aneurysm (AAA) by ultrasound is recommended for men aged 25-75 years who are current or former smokers. What should I know about preventing infection? Hepatitis B If you have a higher risk for hepatitis B, you should be screened for this virus. Talk with your health care provider to find out if you are at risk for hepatitis B infection. Hepatitis C Blood testing is recommended for:  Everyone born from [birth year] through 1963.  Anyone with known risk factors for hepatitis C.  Sexually Transmitted Diseases (STDs)  You should be screened each year for STDs including gonorrhea and chlamydia if: ? You are sexually active and are younger than 59 years of age. ? You are older than 59 years of age and your health care provider tells you that you are at risk for this type of infection. ? Your sexual activity has changed since you were last screened and you are at an increased risk for chlamydia or gonorrhea. Ask your health care provider if you are at risk.  Talk with your health care provider about whether you are at high risk of being infected with HIV. Your health care provider may recommend a prescription medicine to help prevent HIV infection.  What else can I do?  Schedule regular health, dental, and eye exams.  Stay current with your vaccines (immunizations).  Do not use any tobacco products, such as cigarettes, chewing tobacco, and e-cigarettes. If you need help quitting, ask your health care  provider.  Limit alcohol intake to no more than 2 drinks per day. One drink equals 12 ounces of beer, 5 ounces of wine, or 1 ounces of hard liquor.  Do not use street drugs.  Do not share needles.  Ask your health care provider for help if you need support or information about quitting drugs.  Tell your health care provider if you often feel depressed.  Tell your health care provider if you have ever been abused or do not feel safe at home. This information is not intended to replace advice given to you by your health care provider. Make sure you discuss any questions you have with your health care provider. Document Released: 10/02/2007 Document Revised: 12/03/2015 Document Reviewed: 01/07/2015 Elsevier Interactive Patient Education  [employer].

## 2016-12-16 NOTE — Progress Notes (Signed)
Subjective:    Patient ID: Jake Pearson, male    DOB: 06/27/1955, 61 y.o.   MRN: [id number]  HPI 61 year old male comes in today for complete physical exam.  He does hike some for exercise but no specific regular exercise routine. No chest pain or breathing problems with exercise.  He does have a few concerns today:  #1. Recurrent sinus infections-he had rhinoplasty years ago but felt like it really didn't help. He denies any specific allergy symptoms. He wants to know what we could do about it.  #2. Nocturia. He's been getting up anywhere from 2-3 times at night to urinate. He has a history of a mildly enlarged prostate gland. He does also report frequent urination.  #3. Frequent muscle cramping. icular affects the lower abdomen t can even happen in his upper back things lik of work with his hands. He has not tried any treatments and he's never been evaluated for this before.   Review of Systems  Comprehensive review of systems is negativeexcept for history of present illness above..   BP 123/70   Pulse 72   Wt 172 lb (78 kg)   SpO2 98%   BMI 26.15 kg/m     No Known Allergies  Past Medical History:  Diagnosis Date  . Enlarged prostate   . History of nephrolithiasis     Past Surgical History:  Procedure Laterality Date  . ARTHROSCOPIC REPAIR ACL    . KNEE ARTHROCENTESIS    . TONSILLECTOMY  Age 89   . VASECTOMY      Social History   Social History  . Marital status: Married    Spouse name: Aram BeechamCynthia  . Number of children: 2  . Years of education: N/A   Occupational History  .  [employer]   Social History Main Topics  . Smoking status: Never Smoker  . Smokeless tobacco: Never Used  . Alcohol use 0.5 oz/week    1 drink(s) per week  . Drug use: No  . Sexual activity: Yes    Partners: Female   Other Topics Concern  . 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  Has 2 kids.     Family History  Problem Relation Age  of Onset  . Diabetes Maternal Uncle   . Diabetes Maternal Aunt   . Heart attack Maternal Grandfather   . Heart attack Maternal Uncle     Outpatient Encounter Prescriptions as of 12/16/2016  Medication Sig  . fluticasone (FLONASE) 50 MCG/ACT nasal spray Place 2 sprays into both nostrils daily.  . [DISCONTINUED] azithromycin (ZITHROMAX Z-PAK) 250 MG tablet Take 2 tablets (500 mg) on  Day 1,  followed by 1 tablet (250 mg) once daily on Days 2 through 5.  . [DISCONTINUED] cetirizine (ZYRTEC) 10 MG tablet Take 1 tablet (10 mg total) by mouth daily.  . [DISCONTINUED] predniSONE (DELTASONE) 50 MG tablet One tab PO daily for 5 days.   No facility-administered encounter medications on file as of 12/16/2016.           Objective:   Physical Exam  Constitutional: He is oriented to person, place, and time. He appears well-developed and well-nourished.  HENT:  Head: Normocephalic and atraumatic.  Right Ear: External ear normal.  Left Ear: External ear normal.  Nose: Nose normal.  Mouth/Throat: Oropharynx is clear and moist.  Eyes: Pupils are equal, round, and reactive to light. Conjunctivae and EOM are normal.  Neck: Normal  range of motion. Neck supple. No thyromegaly present.  Cardiovascular: Normal rate, regular rhythm, normal heart sounds and intact distal pulses.   Pulmonary/Chest: Effort normal and breath sounds normal.  Abdominal: Soft. Bowel sounds are normal. He exhibits no distension and no mass. There is no tenderness. There is no rebound and no guarding.  Musculoskeletal: Normal range of motion.  Lymphadenopathy:    He has no cervical adenopathy.  Neurological: He is alert and oriented to person, place, and time. He has normal reflexes.  Skin: Skin is warm and dry.  Psychiatric: He has a normal mood and affect. His behavior is normal. Judgment and thought content normal.        Assessment & Plan:  CPE Keep up a regular exercise program and make sure you are eating a healthy  diet Try to eat 4 servings of dairy a day, or if you are lactose intolerant take a calcium with vitamin D daily.  Your vaccines are up to date.    BPH with urinary frequency - AUA score of 4 today which rates into the mild category that he rates his quality of life as to, mostly satisfied. Discussed that we can certainly consider medications in the future to help control his symp. He will think about it.  Muscle cramping - we'll do some blood work to evaluate for electrolyte disorder as well as check a CK and a thyroid level.  Recurrent sinusitis-referred to ENT for further evaluation.

## 2016-12-17 ENCOUNTER — Telehealth: Payer: Self-pay

## 2016-12-17 NOTE — Telephone Encounter (Signed)
Left vm for pt to return call to clinic -EH/RMA  

## 2016-12-17 NOTE — Telephone Encounter (Signed)
-----   Message from [facility] Surgery CenterCharley Elizabeth Cummings, [place]PA-C sent at 12/14/2016 12:50 PM EDT ----- Regarding: Cologuard Cologuard was negative. Recommend repeat screening in 3 years

## 2016-12-22 ENCOUNTER — Encounter: Payer: Self-pay | Admitting: Physician Assistant

## 2016-12-23 ENCOUNTER — Telehealth: Payer: Self-pay | Admitting: Physician Assistant

## 2016-12-23 NOTE — Telephone Encounter (Signed)
-----   Message from Charley Elizabeth Cummings, PA-C sent at 12/14/2016 12:50 PM EDT ----- Regarding: Cologuard Cologuard was negative. Recommend repeat screening in 3 years 

## 2016-12-23 NOTE — Telephone Encounter (Signed)
Left VM for Pt to return clinic call regarding results, callback information provided. 

## 2016-12-24 ENCOUNTER — Telehealth: Payer: Self-pay

## 2016-12-24 NOTE — Telephone Encounter (Signed)
Left vm for pt to return call to clinic (2nd attempt).  Letter mailed. -EH/RMA

## 2016-12-24 NOTE — Telephone Encounter (Signed)
Pt called and was given results of cologuard.

## 2017-06-16 ENCOUNTER — Encounter: Payer: Self-pay | Admitting: Emergency Medicine

## 2017-06-16 ENCOUNTER — Other Ambulatory Visit: Payer: Self-pay

## 2017-06-16 ENCOUNTER — Emergency Department
Admission: EM | Admit: 2017-06-16 | Discharge: 2017-06-16 | Disposition: A | Discharge status: Home / Self Care | Payer: BC Managed Care – PPO | Source: Home / Self Care

## 2017-06-16 DIAGNOSIS — N390 Urinary tract infection, site not specified: Secondary | ICD-10-CM

## 2017-06-16 DIAGNOSIS — R300 Dysuria: Secondary | ICD-10-CM

## 2017-06-16 LAB — POCT URINALYSIS DIP (MANUAL ENTRY)
BILIRUBIN UA: NEGATIVE
Glucose, UA: NEGATIVE mg/dL
Ketones, POC UA: NEGATIVE mg/dL
NITRITE UA: NEGATIVE
Protein Ur, POC: 100 mg/dL — AB
Spec Grav, UA: >=1.03 — AB (ref 1.010–1.025)
Urobilinogen, UA: 0.2 E.U./dL
pH, UA: 5.5 (ref 5.0–8.0)

## 2017-06-16 MED ORDER — CEPHALEXIN 500 MG PO CAPS: 500.0000 mg | ORAL_CAPSULE | Freq: Four times a day (QID) | ORAL | 0 refills | Status: DC

## 2017-06-16 NOTE — Discharge Instructions (Signed)
Return if any problems.

## 2017-06-16 NOTE — ED Triage Notes (Signed)
Dysuria x 2 days.

## 2017-06-16 NOTE — ED Provider Notes (Signed)
[facility]    CSN: [id number] Arrival date & time: 06/16/17  1729     History   Chief Complaint Chief Complaint  Patient presents with  . Dysuria    HPI Jake Pearson is a 62 y.o. male.   The history is provided by the patient. No language interpreter was used.  Dysuria  This is a new problem. The current episode started more than 2 days ago. The problem occurs constantly. The problem has been gradually worsening. Pertinent negatives include no abdominal pain. Nothing aggravates the symptoms. Nothing relieves the symptoms. He has tried nothing for the symptoms. The treatment provided no relief.   Pt complains of burning with urination.  Pt report he has had uti's in the past.  No std risk  Past Medical History:  Diagnosis Date  . Enlarged prostate   . History of nephrolithiasis     Patient Active Problem List   Diagnosis Date Noted  . Subacute cough 11/17/2016  . Hearing loss of left ear 11/17/2016  . Enlarged prostate 05/15/2013    Past Surgical History:  Procedure Laterality Date  . ARTHROSCOPIC REPAIR ACL    . KNEE ARTHROCENTESIS    . TONSILLECTOMY  Age 89   . VASECTOMY         Home Medications    Prior to Admission medications   Medication Sig Start Date End Date Taking? Authorizing Provider  cephALEXin (KEFLEX) 500 MG capsule Take 1 capsule (500 mg total) by mouth 4 (four) times daily. 06/16/17   Elson AreasSofia, Keslyn Teater K, PA-C  fluticasone (FLONASE) 50 MCG/ACT nasal spray Place 2 sprays into both nostrils daily. 11/17/16   Carlis Stableummings, Charley Elizabeth, PA-C    Family History Family History  Problem Relation Age of Onset  . Diabetes Maternal Uncle   . Diabetes Maternal Aunt   . Heart attack Maternal Grandfather   . Heart attack Maternal Uncle     Social History Social History   Tobacco Use  . Smoking status: Never Smoker  . Smokeless tobacco: Never Used  Substance Use Topics  . Alcohol use: Yes    Alcohol/week: 0.5 oz    Types: 1 drink(s)  per week  . Drug use: No     Allergies   Patient has no known allergies.   Review of Systems Review of Systems  Gastrointestinal: Negative for abdominal pain.  Genitourinary: Positive for dysuria.  All other systems reviewed and are negative.    Physical Exam Triage Vital Signs ED Triage Vitals [06/16/17 1753]  Enc Vitals Group     BP 122/77     Pulse Rate 73     Resp      Temp 98.1 F (36.7 C)     Temp Source Oral     SpO2 96 %     Weight 169 lb (76.7 kg)     Height 5\' 8"  (1.727 m)     Head Circumference      Peak Flow      Pain Score 5     Pain Loc      Pain Edu?      Excl. in GC?    No data found.  Updated Vital Signs BP 122/77 (BP Location: Right Arm)   Pulse 73   Temp 98.1 F (36.7 C) (Oral)   Ht 5\' 8"  (1.727 m)   Wt 169 lb (76.7 kg)   SpO2 96%   BMI 25.70 kg/m   Visual Acuity Right Eye Distance:   Left Eye Distance:  Bilateral Distance:    Right Eye Near:   Left Eye Near:    Bilateral Near:     Physical Exam  Constitutional: He appears well-developed and well-nourished.  HENT:  Head: Normocephalic and atraumatic.  Eyes: Conjunctivae are normal.  Neck: Neck supple.  Cardiovascular: Normal rate and regular rhythm.  No murmur heard. Pulmonary/Chest: Effort normal and breath sounds normal. No respiratory distress.  Abdominal: Soft. There is no tenderness.  Musculoskeletal: He exhibits no edema.  Neurological: He is alert.  Skin: Skin is warm and dry.  Psychiatric: He has a normal mood and affect.  Nursing note and vitals reviewed.    UC Treatments / Results  Labs (all labs ordered are listed, but only abnormal results are displayed) Labs Reviewed  POCT URINALYSIS DIP (MANUAL ENTRY) - Abnormal; Notable for the following components:      Result Value   Clarity, UA cloudy (*)    Spec Grav, UA >=1.030 (*)    Blood, UA small (*)    Protein Ur, POC =100 (*)    Leukocytes, UA Moderate (2+) (*)    All other components within normal  limits  URINE CULTURE    EKG  EKG Interpretation None       Radiology No results found.  Procedures Procedures (including critical care time)  Medications Ordered in UC Medications - No data to display   Initial Impression / Assessment and Plan / UC Course  I have reviewed the triage vital signs and the nursing notes.  Pertinent labs & imaging results that were available during my care of the patient were reviewed by me and considered in my medical decision making (see chart for details).     MDM  Urine culture sent.  I will start pt on keflex.   Final Clinical Impressions(s) / UC Diagnoses   Final diagnoses:  Dysuria  Urinary tract infection without hematuria, site unspecified    ED Discharge Orders        Ordered    cephALEXin (KEFLEX) 500 MG capsule  4 times daily     06/16/17 1826      An After Visit Summary was printed and given to the patient.  Controlled Substance Prescriptions [place] Controlled Substance Registry consulted? Not Applicable   Elson Areas, [place] 06/16/17 1901

## 2017-06-17 LAB — URINE CULTURE
MICRO NUMBER:: 90264067
RESULT: NO GROWTH
SPECIMEN QUALITY:: ADEQUATE

## 2017-06-18 ENCOUNTER — Telehealth: Payer: Self-pay | Admitting: Emergency Medicine

## 2017-06-18 NOTE — Telephone Encounter (Signed)
Patient informed of lab results.  States that he is feeling somewhat better, still having urgency and dysuria.  Advised that urine culture was negative and may need to f/u with a urologists for further work-up if sxs persists.  Patient voices understanding.

## 2017-07-22 ENCOUNTER — Ambulatory Visit (INDEPENDENT_AMBULATORY_CARE_PROVIDER_SITE_OTHER): Payer: BC Managed Care – PPO

## 2017-07-22 ENCOUNTER — Encounter: Payer: Self-pay | Admitting: Sports Medicine

## 2017-07-22 ENCOUNTER — Ambulatory Visit (INDEPENDENT_AMBULATORY_CARE_PROVIDER_SITE_OTHER): Payer: BC Managed Care – PPO | Admitting: Sports Medicine

## 2017-07-22 DIAGNOSIS — R1084 Generalized abdominal pain: Secondary | ICD-10-CM

## 2017-07-22 DIAGNOSIS — R1031 Right lower quadrant pain: Secondary | ICD-10-CM | POA: Diagnosis not present

## 2017-07-22 DIAGNOSIS — M19011 Primary osteoarthritis, right shoulder: Secondary | ICD-10-CM | POA: Diagnosis not present

## 2017-07-22 LAB — POCT URINALYSIS DIPSTICK
Bilirubin, UA: NEGATIVE
Blood, UA: NEGATIVE
Glucose, UA: NEGATIVE
Ketones, UA: NEGATIVE
Leukocytes, UA: NEGATIVE
Nitrite, UA: NEGATIVE
Protein, UA: NEGATIVE
Spec Grav, UA: >=1.03 — AB (ref 1.010–1.025)
Urobilinogen, UA: 0.2 E.U./dL
pH, UA: 6 (ref 5.0–8.0)

## 2017-07-22 MED ORDER — MELOXICAM 15 MG PO TABS: ORAL_TABLET | ORAL | 3 refills | Status: DC

## 2017-07-22 NOTE — Assessment & Plan Note (Signed)
X-rays, we will start conservatively with meloxicam, rehab exercises. His pain is coming likely from the acromioclavicular joint. Return to see me in 1 month, injection if no better.

## 2017-07-22 NOTE — Assessment & Plan Note (Signed)
Urinalysis and urine culture here. This is been present on and off for many months, at this point we do need to image his belly, CT of the abdomen and pelvis with oral and IV contrast, CBC, CMP, amylase, lipase, PSA.

## 2017-07-22 NOTE — Progress Notes (Signed)
Subjective:    I'm seeing this patient as a consultation for: Jake Gasser, MD  CC: Right shoulder pain, abdominal pain  HPI: Right shoulder pain: Present for months, localized over the acromioclavicular joint and worse with crossarm activities, moderate, persistent, localized without radiation.  No trauma.  Abdominal pain: Right lower quadrant, moderate, persistent, present on and off for many months, he did have a pyelonephritis that was well treated.  No melena, hematochezia, hematemesis.  No fevers or chills.  I reviewed the past medical history, family history, social history, surgical history, and allergies today and no changes were needed.  Please see the problem list section below in epic for further details.  Past Medical History: Past Medical History:  Diagnosis Date  . Enlarged prostate   . History of nephrolithiasis    Past Surgical History: Past Surgical History:  Procedure Laterality Date  . ARTHROSCOPIC REPAIR ACL    . KNEE ARTHROCENTESIS    . TONSILLECTOMY  Age 62   . VASECTOMY     Social History: Social History   Socioeconomic History  . Marital status: Married    Spouse name: Aram Beecham  . Number of children: 2  . Years of education: Not on file  . Highest education level: Not on file  Occupational History    Employer: [employer]  Social Needs  . Financial resource strain: Not on file  . Food insecurity:    Worry: Not on file    Inability: Not on file  . Transportation needs:    Medical: Not on file    Non-medical: Not on file  Tobacco Use  . Smoking status: Never Smoker  . Smokeless tobacco: Never Used  Substance and Sexual Activity  . Alcohol use: Yes    Alcohol/week: 0.5 oz    Types: 1 drink(s) per week  . Drug use: No  . Sexual activity: Yes    Partners: Female  Lifestyle  . Physical activity:    Days per week: Not on file    Minutes per session: Not on file  . Stress: Not on file  Relationships  . Social connections:   Talks on phone: Not on file    Gets together: Not on file    Attends religious service: Not on file    Active member of club or organization: Not on file    Attends meetings of clubs or organizations: Not on file    Relationship status: Not on file  Other Topics Concern  . 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    Family History: Family History  Problem Relation Age of Onset  . Diabetes Maternal Uncle   . Diabetes Maternal Aunt   . Heart attack Maternal Grandfather   . Heart attack Maternal Uncle    Allergies: No Known Allergies Medications: See med rec.  Review of Systems: No headache, visual changes, nausea, vomiting, diarrhea, constipation, dizziness, abdominal pain, skin rash, fevers, chills, night sweats, weight loss, swollen lymph nodes, body aches, joint swelling, muscle aches, chest pain, shortness of breath, mood changes, visual or auditory hallucinations.   Objective:   General: Well Developed, well nourished, and in no acute distress.  Neuro:  Extra-ocular muscles intact, able to move all 4 extremities, sensation grossly intact.  Deep tendon reflexes tested were normal. Psych: Alert and oriented, mood congruent with affect. ENT:  Ears and nose appear unremarkable.  Hearing grossly normal. Neck: Unremarkable overall appearance,  trachea midline.  No visible thyroid enlargement. Eyes: Conjunctivae and lids appear unremarkable.  Pupils equal and round. Skin: Warm and dry, no rashes noted.  Cardiovascular: Pulses palpable, no extremity edema. Right shoulder: Inspection reveals no abnormalities, atrophy or asymmetry. Severe tenderness over the acromioclavicular joint with a palpable lump. ROM is full in all planes. Rotator cuff strength normal throughout. No signs of impingement with negative Neer and Hawkin's tests, empty can. Speeds and Yergason's tests normal. No labral pathology noted with negative Obrien's, negative  crank, negative clunk, and good stability. Normal scapular function observed. No painful arc and no drop arm sign. No apprehension sign Abdomen: Soft, tender to palpation in the right lower quadrant, nondistended, no bowel sounds, no palpable masses, no guarding, rigidity, rebound tenderness, no costovertebral angle pain.  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  Impression and Recommendations:   This case required medical decision making of moderate complexity.  Right lower quadrant abdominal pain Urinalysis and urine culture here. This is been present on and off for many months, at this point we do need to image his belly, CT of the abdomen and pelvis with oral and IV contrast, CBC, CMP, amylase, lipase, PSA.   Arthritis of right acromioclavicular joint X-rays, we will start conservatively with meloxicam, rehab exercises. His pain is coming likely from the acromioclavicular joint. Return to see me in 1 month, injection if no better. ___________________________________________ Ihor Austinhomas J. Benjamin Stainhekkekandam, M.D., ABFM., CAQSM. Primary Care and Sports Medicine [facility]  Adjunct Instructor of Family Medicine  [employer]

## 2017-07-23 LAB — URINE CULTURE
MICRO NUMBER:: 90423730
Result:: NO GROWTH
SPECIMEN QUALITY:: ADEQUATE

## 2017-07-27 LAB — CBC WITH DIFFERENTIAL/PLATELET
Basophils Absolute: 19 cells/uL (ref 0–200)
Basophils Relative: 0.3 %
Eosinophils Absolute: 180 {cells}/uL (ref 15–500)
Eosinophils Relative: 2.9 %
HCT: 39.8 % (ref 38.5–50.0)
Hemoglobin: 13.8 g/dL (ref 13.2–17.1)
Lymphs Abs: 1631 {cells}/uL (ref 850–3900)
MCH: 31.1 pg (ref 27.0–33.0)
MCHC: 34.7 g/dL (ref 32.0–36.0)
MCV: 89.6 fL (ref 80.0–100.0)
MPV: 10.7 fL (ref 7.5–12.5)
Monocytes Relative: 7.5 %
Neutro Abs: 3906 cells/uL (ref 1500–7800)
Neutrophils Relative %: 63 %
Platelets: 167 10*3/uL (ref 140–400)
RBC: 4.44 Million/uL (ref 4.20–5.80)
RDW: 12 % (ref 11.0–15.0)
Total Lymphocyte: 26.3 %
WBC mixed population: 465 {cells}/uL (ref 200–950)
WBC: 6.2 10*3/uL (ref 3.8–10.8)

## 2017-07-27 LAB — LIPASE: Lipase: 45 U/L (ref 7–60)

## 2017-07-27 LAB — COMPREHENSIVE METABOLIC PANEL WITH GFR
Albumin: 4.1 g/dL (ref 3.6–5.1)
Alkaline phosphatase (APISO): 48 U/L (ref 40–115)
BUN: 15 mg/dL (ref 7–25)
Creat: 1.11 mg/dL (ref 0.70–1.33)
Glucose, Bld: 72 mg/dL (ref 65–99)
Potassium: 3.9 mmol/L (ref 3.5–5.3)

## 2017-07-27 LAB — COMPREHENSIVE METABOLIC PANEL
AG Ratio: 1.6 (calc) (ref 1.0–2.5)
ALT: 25 U/L (ref 9–46)
AST: 16 U/L (ref 10–35)
CO2: 28 mmol/L (ref 20–32)
Calcium: 9.2 mg/dL (ref 8.6–10.3)
Chloride: 106 mmol/L (ref 98–110)
Globulin: 2.6 g/dL (calc) (ref 1.9–3.7)
Sodium: 141 mmol/L (ref 135–146)
Total Bilirubin: 1.1 mg/dL (ref 0.2–1.2)
Total Protein: 6.7 g/dL (ref 6.1–8.1)

## 2017-07-27 LAB — PSA, TOTAL AND FREE
PSA, % Free: 25 % — ABNORMAL LOW (ref >25)
PSA, Free: 0.3 ng/mL
PSA, Total: 1.2 ng/mL (ref <=4.0)

## 2017-07-27 LAB — AMYLASE: Amylase: 55 U/L (ref 21–101)

## 2017-08-04 ENCOUNTER — Ambulatory Visit (INDEPENDENT_AMBULATORY_CARE_PROVIDER_SITE_OTHER): Payer: BC Managed Care – PPO

## 2017-08-04 DIAGNOSIS — K760 Fatty (change of) liver, not elsewhere classified: Secondary | ICD-10-CM | POA: Diagnosis not present

## 2017-08-04 DIAGNOSIS — N323 Diverticulum of bladder: Secondary | ICD-10-CM

## 2017-08-04 DIAGNOSIS — R1084 Generalized abdominal pain: Secondary | ICD-10-CM

## 2017-08-04 DIAGNOSIS — K4090 Unilateral inguinal hernia, without obstruction or gangrene, not specified as recurrent: Secondary | ICD-10-CM | POA: Diagnosis not present

## 2017-08-04 DIAGNOSIS — R1031 Right lower quadrant pain: Secondary | ICD-10-CM

## 2017-08-04 MED ORDER — IOPAMIDOL (ISOVUE-300) INJECTION 61%
100.0000 mL | Freq: Once | INTRAVENOUS | Status: AC | PRN
  Administered 2017-08-04: 100 mL via INTRAVENOUS

## 2017-08-18 ENCOUNTER — Encounter: Payer: Self-pay | Admitting: Sports Medicine

## 2017-08-18 ENCOUNTER — Ambulatory Visit (INDEPENDENT_AMBULATORY_CARE_PROVIDER_SITE_OTHER): Payer: BC Managed Care – PPO | Admitting: Sports Medicine

## 2017-08-18 DIAGNOSIS — M19011 Primary osteoarthritis, right shoulder: Secondary | ICD-10-CM | POA: Diagnosis not present

## 2017-08-18 NOTE — Assessment & Plan Note (Signed)
X-ray confirmed, better with conservative measures, he has since stopped his meloxicam, continue rehab exercises, may use Mobic as needed, return to see me as needed. Follow the above fails we will consider acromioclavicular injection under ultrasound guidance.

## 2017-08-18 NOTE — Progress Notes (Signed)
Subjective:    CC: Follow-up  HPI: This is a pleasant 62 year old male, we diagnosed him with a right acromioclavicular osteoarthritis at the last visit, he has responded well to conservative measures including NSAIDs and rehabilitation exercises, no injection desired.  I reviewed the past medical history, family history, social history, surgical history, and allergies today and no changes were needed.  Please see the problem list section below in epic for further details.  Past Medical History: Past Medical History:  Diagnosis Date  . Enlarged prostate   . History of nephrolithiasis    Past Surgical History: Past Surgical History:  Procedure Laterality Date  . ARTHROSCOPIC REPAIR ACL    . KNEE ARTHROCENTESIS    . TONSILLECTOMY  Age 29   . VASECTOMY     Social History: Social History   Socioeconomic History  . Marital status: Married    Spouse name: Aram Beecham  . Number of children: 2  . Years of education: Not on file  . Highest education level: Not on file  Occupational History    Employer: [employer]  Social Needs  . Financial resource strain: Not on file  . Food insecurity:    Worry: Not on file    Inability: Not on file  . Transportation needs:    Medical: Not on file    Non-medical: Not on file  Tobacco Use  . Smoking status: Never Smoker  . Smokeless tobacco: Never Used  Substance and Sexual Activity  . Alcohol use: Yes    Alcohol/week: 0.5 oz    Types: 1 drink(s) per week  . Drug use: No  . Sexual activity: Yes    Partners: Female  Lifestyle  . Physical activity:    Days per week: Not on file    Minutes per session: Not on file  . Stress: Not on file  Relationships  . Social connections:    Talks on phone: Not on file    Gets together: Not on file    Attends religious service: Not on file    Active member of club or organization: Not on file    Attends meetings of clubs or organizations: Not on file    Relationship status: Not on file  Other  Topics Concern  . 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    Family History: Family History  Problem Relation Age of Onset  . Diabetes Maternal Uncle   . Diabetes Maternal Aunt   . Heart attack Maternal Grandfather   . Heart attack Maternal Uncle    Allergies: No Known Allergies Medications: See med rec.  Review of Systems: No fevers, chills, night sweats, weight loss, chest pain, or shortness of breath.   Objective:    General: Well Developed, well nourished, and in no acute distress.  Neuro: Alert and oriented x3, extra-ocular muscles intact, sensation grossly intact.  HEENT: Normocephalic, atraumatic, pupils equal round reactive to light, neck supple, no masses, no lymphadenopathy, thyroid nonpalpable.  Skin: Warm and dry, no rashes. Cardiac: Regular rate and rhythm, no murmurs rubs or gallops, no lower extremity edema.  Respiratory: Clear to auscultation bilaterally. Not using accessory muscles, speaking in full sentences.  Impression and Recommendations:    Arthritis of right acromioclavicular joint X-ray confirmed, better with conservative measures, he has since stopped his meloxicam, continue rehab exercises, may use Mobic as needed, return to see me as needed. Follow the above fails we will consider  acromioclavicular injection under ultrasound guidance. ___________________________________________ Ihor Austin. Benjamin Stain, M.D., ABFM., CAQSM. Primary Care and Sports Medicine [facility]  Adjunct Instructor of Family Medicine  [employer]

## 2017-10-13 ENCOUNTER — Encounter: Payer: Self-pay | Admitting: Family Medicine

## 2017-10-13 ENCOUNTER — Telehealth: Payer: Self-pay | Admitting: Family Medicine

## 2017-10-13 ENCOUNTER — Ambulatory Visit (INDEPENDENT_AMBULATORY_CARE_PROVIDER_SITE_OTHER): Payer: BC Managed Care – PPO | Admitting: Family Medicine

## 2017-10-13 VITALS — BP 110/65 | HR 59 | Ht 68.0 in | Wt 167.0 lb

## 2017-10-13 DIAGNOSIS — N323 Diverticulum of bladder: Secondary | ICD-10-CM | POA: Diagnosis not present

## 2017-10-13 DIAGNOSIS — R0981 Nasal congestion: Secondary | ICD-10-CM

## 2017-10-13 DIAGNOSIS — R0683 Snoring: Secondary | ICD-10-CM | POA: Diagnosis not present

## 2017-10-13 DIAGNOSIS — K760 Fatty (change of) liver, not elsewhere classified: Secondary | ICD-10-CM | POA: Diagnosis not present

## 2017-10-13 DIAGNOSIS — M549 Dorsalgia, unspecified: Secondary | ICD-10-CM

## 2017-10-13 HISTORY — DX: Dorsalgia, unspecified: M54.9

## 2017-10-13 NOTE — Telephone Encounter (Signed)
Due for Tdap. See if he would like to schedule or he has had done in the last 10 yr then we can update immune record.

## 2017-10-13 NOTE — Patient Instructions (Signed)
Recommend a trial of Flonase or Nasonex at bedtime nightly for 3 weeks.  See if this improves the congestion that you are waking up with in the morning.  If not then I think we should further evaluate you for the possibility of sleep apnea.  We will plan to repeat ultrasound of the liver in 2 to 3 years to keep an eye on the fatty liver.  Just make sure continuing to work on eating very healthy nutritious foods and getting regular exercise to reduce the fat in your liver.  We will also refer you to urology for the bladder diverticulum.

## 2017-10-13 NOTE — Progress Notes (Signed)
Subjective:    Patient ID: Jake Pearson, male    DOB: February 26, 1956, 62 y.o.   MRN: [id number]  HPI 62 year old male is here today to go over recent CT scan results.  He is been experiencing some bilateral mid back pain mostly in the flank area.  Is a little bit worse on the left today.  He just notices a tightness particularly when he rotates to the right.  He is also been having some sensitivity on that left side particularly at night when he tries to lay down.  It will get uncomfortable enough that he has to actually change position and get off that side of his back.  During the day it may bother him but for a few minutes and then it seems to ease off.  He is not really using medication help control symptoms but has been persistent for about 3 months.  He did have a CT scan of the abdomen and pelvis done on April 18 and those were the results he is here to go over today.  CT results did show a small right-sided bladder diverticulum, fatty infiltration of the liver, and small left inguinal hernia containing fat.  There were no other acute findings to explain his mid back pain  He does report that he snores most nights.  And wakes up with a headache most mornings but he feels like a lot of it comes from nasal congestion.  His bedtime he wakes up in the morning he is very congested he has a lot of drainage in the back of his throat and feels him is little sick to his stomach from the drainage.   Review of Systems  Ht 5\' 8"  (1.727 m)   BMI 25.70 kg/m     No Known Allergies  Past Medical History:  Diagnosis Date  . Enlarged prostate   . History of nephrolithiasis     Past Surgical History:  Procedure Laterality Date  . ARTHROSCOPIC REPAIR ACL    . KNEE ARTHROCENTESIS    . TONSILLECTOMY  Age 51   . VASECTOMY      Social History   Socioeconomic History  . Marital status: Married    Spouse name: Aram Beecham  . Number of children: 2  . Years of education: Not on file  . Highest  education level: Not on file  Occupational History    Employer: [employer]  Social Needs  . Financial resource strain: Not on file  . Food insecurity:    Worry: Not on file    Inability: Not on file  . Transportation needs:    Medical: Not on file    Non-medical: Not on file  Tobacco Use  . Smoking status: Never Smoker  . Smokeless tobacco: Never Used  Substance and Sexual Activity  . Alcohol use: Yes    Alcohol/week: 0.6 oz    Types: 1 drink(s) per week  . Drug use: No  . Sexual activity: Yes    Partners: Female  Lifestyle  . Physical activity:    Days per week: Not on file    Minutes per session: Not on file  . Stress: Not on file  Relationships  . Social connections:    Talks on phone: Not on file    Gets together: Not on file    Attends religious service: Not on file    Active member of club or organization: Not on file    Attends meetings of clubs or organizations: Not on file  Relationship status: Not on file  . Intimate partner violence:    Fear of current or ex partner: Not on file    Emotionally abused: Not on file    Physically abused: Not on file    Forced sexual activity: Not on file  Other Topics Concern  . 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     Family History  Problem Relation Age of Onset  . Diabetes Maternal Uncle   . Diabetes Maternal Aunt   . Heart attack Maternal Grandfather   . Heart attack Maternal Uncle     Outpatient Encounter Medications as of 10/13/2017  Medication Sig  . meloxicam (MOBIC) 15 MG tablet One tab PO qAM with breakfast for 2 weeks, then daily prn pain.   No facility-administered encounter medications on file as of 10/13/2017.          Objective:   Physical Exam  Constitutional: He is oriented to person, place, and time. He appears well-developed and well-nourished.  HENT:  Head: Normocephalic and atraumatic.  Cardiovascular: Normal rate, regular rhythm and  normal heart sounds.  Pulmonary/Chest: Effort normal and breath sounds normal.  Musculoskeletal:  Normal lumbar flexion, extension, rotation right left and side bending.  He did have pain with rotation to the right though.  Nontender over the paraspinous muscles.  Normal gait.  Strength in lower extremities is 5 out of 5.  Neurological: He is alert and oriented to person, place, and time.  Skin: Skin is warm and dry.  Psychiatric: He has a normal mood and affect. His behavior is normal.          Assessment & Plan:   Nighttime nasal congestion - Recommend a trial of Flonase or Nasonex at bedtime nightly for 3 weeks.  See if this improves the congestion that you are waking up with in the morning.  If not then I think we should further evaluate you for the possibility of sleep apnea.  Fatty liver - We will plan to repeat ultrasound of the liver in 2 to 3 years to keep an eye on the fatty liver.  Just make sure continuing to work on eating very healthy nutritious foods and getting regular exercise to reduce the fat in your liver.  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  Left inguinal hernia-he is completely asymptomatic and we discussed options.  He wants to hold off on any treatment or work-up at this point.  Regards to his mid mid back pain-okay to use ibuprofen or Tylenol.  Plan is refer to physical therapy.  They should be contacting him soon to get him scheduled.

## 2017-10-13 NOTE — Telephone Encounter (Signed)
Spoke to pt, he is scheduled to get Tdap on 10-19-17 at 3:00

## 2017-10-19 ENCOUNTER — Encounter: Payer: Self-pay | Admitting: Rehabilitative and Restorative Service Providers"

## 2017-10-19 ENCOUNTER — Ambulatory Visit (INDEPENDENT_AMBULATORY_CARE_PROVIDER_SITE_OTHER): Payer: BC Managed Care – PPO | Admitting: Family Medicine

## 2017-10-19 ENCOUNTER — Ambulatory Visit: Payer: BC Managed Care – PPO | Admitting: Rehabilitative and Restorative Service Providers"

## 2017-10-19 VITALS — BP 113/75 | HR 82 | Temp 97.9°F | Wt 168.0 lb

## 2017-10-19 DIAGNOSIS — Z7409 Other reduced mobility: Secondary | ICD-10-CM

## 2017-10-19 DIAGNOSIS — M545 Low back pain, unspecified: Secondary | ICD-10-CM

## 2017-10-19 DIAGNOSIS — M549 Dorsalgia, unspecified: Secondary | ICD-10-CM | POA: Diagnosis not present

## 2017-10-19 DIAGNOSIS — M2560 Stiffness of unspecified joint, not elsewhere classified: Secondary | ICD-10-CM | POA: Diagnosis not present

## 2017-10-19 DIAGNOSIS — G8929 Other chronic pain: Secondary | ICD-10-CM | POA: Diagnosis not present

## 2017-10-19 DIAGNOSIS — Z23 Encounter for immunization: Secondary | ICD-10-CM | POA: Diagnosis not present

## 2017-10-19 NOTE — Patient Instructions (Signed)
Abdominal Bracing With Pelvic Floor (Hook-Lying)    With neutral spine, tighten pelvic floor and abdominals sucking belly button to back bone; tighten muscles in low back at waist  Hold 10 sec. Repeat _10__ times. Do several times a day. Progress to do this in sitting; standing; walking nd with functional activities   Trunk: Prone Extension (Press-Ups)    Lie on stomach on firm, flat surface. Relax bottom and legs. Raise chest in air with elbows straight. Keep hips flat on surface, sag stomach. Hold __1-2__ seconds. Repeat _5-8___ times. Do __1-2__ sessions per day. CAUTION: Movement should be gentle and slow.  HIP: Hamstrings - Supine  Place strap around foot. Raise leg up, keeping knee straight.  Bend opposite knee to protect back if indicated. Hold 30 seconds. 3 reps per set, 2-3 sets per day  Outer Hip Stretch: Reclined IT Band Stretch (Strap)   Strap around one foot, pull leg across body until you feel a pull or stretch in the outside of your hip, with shoulders on mat. Hold for 30 seconds. Repeat 3 times each leg. 2-3 times/day.  Piriformis Stretch   Lying on back, pull right knee toward opposite shoulder. Hold 30 seconds. Repeat 3 times. Do 2-3 sessions per day. Foot on surface pulling knee across body - can use strap   Trunk: Knees to Chest    Lie on firm, flat surface. Keep head and shoulders flat on surface. Tuck hands behind knees and pull to chest. Hold _20-30___ seconds. Repeat __2-3__ times. Do __1-2__ sessions per day. CAUTION: Movement should be gentle and slow.   Sleeping on Back  Place pillow under knees. A pillow with cervical support and a roll around waist are also helpful. Copyright  VHI. All rights reserved.  Sleeping on Side Place pillow between knees. Use cervical support under neck and a roll around waist as needed. Copyright  VHI. All rights reserved.   Sleeping on Stomach   If this is the only desirable sleeping position, place pillow  under lower legs, and under stomach or chest as needed.  Posture - Sitting   Sit upright, head facing forward. Try using a roll to support lower back. Keep shoulders relaxed, and avoid rounded back. Keep hips level with knees. Avoid crossing legs for long periods. Stand to Sit / Sit to Stand   To sit: Bend knees to lower self onto front edge of chair, then scoot back on seat. To stand: Reverse sequence by placing one foot forward, and scoot to front of seat. Use rocking motion to stand up.   Work Height and Reach  Ideal work height is no more than 2 to 4 inches below elbow level when standing, and at elbow level when sitting. Reaching should be limited to arm's length, with elbows slightly bent.  Bending  Bend at hips and knees, not back. Keep feet shoulder-width apart.    Posture - Standing   Good posture is important. Avoid slouching and forward head thrust. Maintain curve in low back and align ears over shoul- ders, hips over ankles.  Alternating Positions   Alternate tasks and change positions frequently to reduce fatigue and muscle tension. Take rest breaks. Computer Work   Position work to Art gallery manager. Use proper work and seat height. Keep shoulders back and down, wrists straight, and elbows at right angles. Use chair that provides full back support. Add footrest and lumbar roll as needed.  Getting Into / Out of Car  Lower self onto seat, scoot back,  then bring in one leg at a time. Reverse sequence to get out.  Dressing  Lie on back to pull socks or slacks over feet, or sit and bend leg while keeping back straight.    Housework - Sink  Place one foot on ledge of cabinet under sink when standing at sink for prolonged periods.   Pushing / Pulling  Pushing is preferable to pulling. Keep back in proper alignment, and use leg muscles to do the work.  Deep Squat   Squat and lift with both arms held against upper trunk. Tighten stomach muscles without holding  breath. Use smooth movements to avoid jerking.  Avoid Twisting   Avoid twisting or bending back. Pivot around using foot movements, and bend at knees if needed when reaching for articles.  Carrying Luggage   Distribute weight evenly on both sides. Use a cart whenever possible. Do not twist trunk. Move body as a unit.   Lifting Principles .Maintain proper posture and head alignment. .Slide object as close as possible before lifting. .Move obstacles out of the way. .Test before lifting; ask for help if too heavy. .Tighten stomach muscles without holding breath. .Use smooth movements; do not jerk. .Use legs to do the work, and pivot with feet. .Distribute the work load symmetrically and close to the center of trunk. .Push instead of pull whenever possible.   Ask For Help   Ask for help and delegate to others when possible. Coordinate your movements when lifting together, and maintain the low back curve.  Log Roll   Lying on back, bend left knee and place left arm across chest. Roll all in one movement to the right. Reverse to roll to the left. Always move as one unit. Housework - Sweeping  Use long-handled equipment to avoid stooping.   Housework - Wiping  Position yourself as close as possible to reach work surface. Avoid straining your back.  Laundry - Unloading Wash   To unload small items at bottom of washer, lift leg opposite to arm being used to reach.  Gardening - Raking  Move close to area to be raked. Use arm movements to do the work. Keep back straight and avoid twisting.     Cart  When reaching into cart with one arm, lift opposite leg to keep back straight.   Getting Into / Out of Bed  Lower self to lie down on one side by raising legs and lowering head at the same time. Use arms to assist moving without twisting. Bend both knees to roll onto back if desired. To sit up, start from lying on side, and use same move-ments in reverse. Housework -  Vacuuming  Hold the vacuum with arm held at side. Step back and forth to move it, keeping head up. Avoid twisting.   Laundry - Armed forces training and education officerLoading Wash  Position laundry basket so that bending and twisting can be avoided.   Laundry - Unloading Dryer  Squat down to reach into clothes dryer or use a reacher.  Gardening - Weeding / Psychiatric nurselanting  Squat or Kneel. Knee pads may be helpful.                     TENS UNIT: This is helpful for muscle pain and spasm.   Search and Purchase a TENS 7000 2nd edition at www.tenspros.com. It should be less than $30.     TENS unit instructions: Do not shower or bathe with the unit on Turn the unit off before removing electrodes  or batteries If the electrodes lose stickiness add a drop of water to the electrodes after they are disconnected from the unit and place on plastic sheet. If you continued to have difficulty, call the TENS unit company to purchase more electrodes. Do not apply lotion on the skin area prior to use. Make sure the skin is clean and dry as this will help prolong the life of the electrodes. After use, always check skin for unusual red areas, rash or other skin difficulties. If there are any skin problems, does not apply electrodes to the same area. Never remove the electrodes from the unit by pulling the wires. Do not use the TENS unit or electrodes other than as directed. Do not change electrode placement without consultating your therapist or physician. Keep 2 fingers with between each electrode.

## 2017-10-19 NOTE — Therapy (Signed)
[facility] [place] [address] [place], [place], [address] Phone: [phone]   Fax:  [phone]  Physical Therapy Evaluation  Patient Details  Name: Jake Pearson MRN: [id number] Date of Birth: [birth year] Referring Provider: Dr Linford Arnold    Encounter Date: 10/19/2017  PT End of Session - 10/19/17 1531    Visit Number  1    Number of Visits  6    Date for PT Re-Evaluation  11/30/17    PT Start Time  1531    PT Stop Time  1630    PT Time Calculation (min)  59 min    Activity Tolerance  Patient tolerated treatment well       Past Medical History:  Diagnosis Date  . Enlarged prostate   . History of nephrolithiasis     Past Surgical History:  Procedure Laterality Date  . ARTHROSCOPIC REPAIR ACL    . KNEE ARTHROCENTESIS    . TONSILLECTOMY  Age 62   . VASECTOMY      There were no vitals filed for this visit.   Subjective Assessment - 10/19/17 1539    Subjective  Patient reports onset of mid to low back pain 12/18 with no known injury. LBP is bilat, mid back pain is more on the Lt. Symptoms have decreased in the LB and increased some in the mid back.     Pertinent History  patient had a back injury in high school with episodic flare ups of pain between shoulder blades; Lt knee surgery years ago     How long can you sit comfortably?  10-15 min - stiff when he stands - favorite chair is a rolling chair or stool at school; sits in a rolling chair at home     How long can you stand comfortably?  no limit     How long can you walk comfortably?  20-30 min     Diagnostic tests  xrays     Patient Stated Goals  get rid of the back pain     Currently in Pain?  Yes    Pain Score  1     Pain Location  Back    Pain Orientation  Right;Left;Mid;Lower bilat LB - Lt mid back     Pain Descriptors / Indicators  Aching;Sharp pulling - mid back more sharp    Pain Type  Chronic pain    Pain Onset  More than a month ago    Pain Frequency   Constant LB is constant; Lt mid back is intermittent     Aggravating Factors   lying on Lt side; prolonged sitting;lifting irritates mid back. twisting; lifting; prolonged sitting    Pain Relieving Factors  change positions; lying flat; heat for LB          [facility] PT Assessment - 10/19/17 0001      Assessment   Medical Diagnosis  mid back pain; LBP     Referring Provider  Dr Linford Arnold     Onset Date/Surgical Date  03/19/17    Hand Dominance  Left    Next MD Visit  PRN    Prior Therapy  none       Precautions   Precautions  None      Balance Screen   Has the patient fallen in the past 6 months  No    Has the patient had a decrease in activity level because of a fear of falling?   No    Is the patient  reluctant to leave their home because of a fear of falling?   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     Leisure  reading; household chores; hiking ~ 3 x's per year; photography - on computer       Observation/Other Assessments   Focus on Therapeutic Outcomes (FOTO)   305 limitation       Sensation   Additional Comments  some numbness in bilat feet - different degrees for several years       Posture/Postural Control   Posture Comments  sits with Lt LE crossed over Rt wieght shifted; head forward; shoudlers rounded with increased thoracic kyphosis       AROM   Overall AROM Comments  decreased ROM Lt knee related to old knee injury/surgery     Right/Left Hip  -- end range tightness bilat hips in ext/IR/ER     Lumbar Flexion  75% pulling    Lumbar Extension  45% tightness    Lumbar - Right Side Bend  70% discomfort Rt LB    Lumbar - Left Side Bend  70% discomfort Rt LB    Lumbar - Right Rotation  65% discomfort Rt LB    Lumbar - Left Rotation  60% discomfort Lt mid back       Strength    Overall Strength Comments  5/5 bilat LE's       Flexibility   Hamstrings  tight ~60-65 deg Rt  70 to 75 deg Lt     Quadriceps  tight Rt 110 deg; Lt 90 deg     ITB  tight Lt > Rt     Piriformis  tight Lt > Rt       Palpation   Spinal mobility  hypomobility through lumbar and thoracic spine    Palpation comment  muscular tightness Lt > Rt QL and lumbar paraspinals; Lt posterior hip musculature       Special Tests   Other special tests  (-) SRL and Fabers bilat                 Objective measurements completed on examination: See above findings.      [facility] Adult PT Treatment/Exercise - 10/19/17 0001      Self-Care   Self-Care  -- initiated back care education      Therapeutic Activites    Therapeutic Activities  -- pt to evaluate his postures and positions @ work/home      Lumbar Exercises: Stretches   Passive Hamstring Stretch  Right;Left;2 reps;30 seconds supine with strap     Double Knee to Chest Stretch  2 reps;20 seconds    Press Ups  -- 1-2 sec x 10 reps     ITB Stretch  Right;Left;2 reps;30 seconds supine with strap     Piriformis Stretch  Right;Left;2 reps;30 seconds supine travell       Lumbar Exercises: Supine   Other Supine Lumbar Exercises  3 part core 10 sec x 10 reps in hooklying position              PT Education - 10/19/17 1625    Education Details  HEP; back care; TENS unit     Person(s) Educated  Patient    Methods  Explanation;Demonstration;Tactile cues;Verbal cues;Handout  Comprehension  Verbalized understanding;Returned demonstration;Verbal cues required;Tactile cues required          PT Long Term Goals - 10/19/17 1640      PT LONG TERM GOAL #1   Title  Improve tissue extensibility; ROM; mobility through bilat LE's increasing HS flexibility by 5-10 degrees 11/30/17    Time  6    Period  Weeks    Status  New      PT LONG TERM GOAL #2   Title  Patient to demonstrate appropriate body mechanics for transitional movements  11/30/17    Time  6    Period  Weeks    Status  New      PT LONG TERM GOAL #3   Title  Decrease pain by 50-75% for functional activities per pt report 11/30/17    Time  6    Period  Weeks    Status  New      PT LONG TERM GOAL #4   Title  Independent in HEP 11/30/17    Time  6    Period  Weeks    Status  New      PT LONG TERM GOAL #5   Title  Improve FOTO to </= 27% limitation 11/30/17    Time  6    Period  Weeks    Status  New             Plan - 10/19/17 1652    Clinical Impression Statement  Annette Stable presents with c/o chronic low and mid back pain with no known injury. He has poor posture; limited trunk and LE mobility/ROM; muscular tightness; hypomobility through the lumbar spine; poor body mechanics in sitting and with transitional movements. He will benefit from PT to address problems identified.     Clinical Presentation  Stable    Clinical Decision Making  Low    Rehab Potential  Good    PT Frequency  1x / week    PT Duration  6 weeks    PT Treatment/Interventions  Patient/family education;ADLs/Self Care Home Management;Cryotherapy;Electrical Stimulation;Iontophoresis 4mg /ml Dexamethasone;Moist Heat;Traction;Ultrasound;Dry needling;Manual techniques;Neuromuscular re-education;Therapeutic activities;Therapeutic exercise    PT Next Visit Plan  review HEP; add pec stretch, hip flexor stretch, lateral trunk flexion in sitting, trunk rotation supine; core stabilization; modalities as indicated; spine care education/body mechnics     Consulted and Agree with Plan of Care  Patient       Patient will benefit from skilled therapeutic intervention in order to improve the following deficits and impairments:  Postural dysfunction, Improper body mechanics, Pain, Increased fascial restricitons, Increased muscle spasms, Decreased range of motion, Decreased mobility  Visit Diagnosis: Mid back pain - Plan: PT plan of care cert/re-cert  Chronic bilateral low back pain without sciatica -  Plan: PT plan of care cert/re-cert  Stiffness due to immobility - Plan: PT plan of care cert/re-cert     Problem List Patient Active Problem List   Diagnosis Date Noted  . Mid back pain 10/13/2017  . Snoring 10/13/2017  . Nasal congestion 10/13/2017  . 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 Fatty liver 10/13/2017  . Arthritis of right acromioclavicular joint 07/22/2017  . Right lower quadrant abdominal pain 07/22/2017  . Hearing loss of left ear 11/17/2016  . Enlarged prostate 05/15/2013    Lailah Marcelli Rober Minion PT, MPH  10/19/2017, 4:57 PM  [facility] [address] [place], [place], [address] Phone: [phone]   Fax:  [phone]  Name: Teresa Lemmerman MRN:  [id number] Date of Birth: [birth year]

## 2017-10-21 NOTE — Progress Notes (Signed)
Given Tdap

## 2017-10-27 ENCOUNTER — Ambulatory Visit: Payer: BC Managed Care – PPO | Admitting: Rehabilitative and Restorative Service Providers"

## 2017-10-27 ENCOUNTER — Encounter: Payer: Self-pay | Admitting: Rehabilitative and Restorative Service Providers"

## 2017-10-27 DIAGNOSIS — M2560 Stiffness of unspecified joint, not elsewhere classified: Secondary | ICD-10-CM

## 2017-10-27 DIAGNOSIS — G8929 Other chronic pain: Secondary | ICD-10-CM | POA: Diagnosis not present

## 2017-10-27 DIAGNOSIS — M549 Dorsalgia, unspecified: Secondary | ICD-10-CM

## 2017-10-27 DIAGNOSIS — M545 Low back pain, unspecified: Secondary | ICD-10-CM

## 2017-10-27 DIAGNOSIS — Z7409 Other reduced mobility: Secondary | ICD-10-CM | POA: Diagnosis not present

## 2017-10-27 NOTE — Therapy (Addendum)
[place], [place], [address] Phone: [phone]   Fax:  [phone]  Physical Therapy Treatment  Patient Details  Name: Jake Pearson MRN: [id number] Date of Birth: Oct 14, 1955 Referring Provider: Dr Jake Pearson   Encounter Date: 10/27/2017  PT End of Session - 10/27/17 1538    Visit Number  2    Number of Visits  6    Date for PT Re-Evaluation  11/30/17    PT Start Time  4481    PT Stop Time  1625    PT Time Calculation (min)  55 min    Activity Tolerance  Patient tolerated treatment well       Past Medical History:  Diagnosis Date  . Enlarged prostate   . History of nephrolithiasis     Past Surgical History:  Procedure Laterality Date  . ARTHROSCOPIC REPAIR ACL    . KNEE ARTHROCENTESIS    . TONSILLECTOMY  Age 62   . VASECTOMY      There were no vitals filed for this visit.  Subjective Assessment - 10/27/17 1541    Subjective  Patient reports that he was sore for 3 days following initial evaluation. He thinks he overdid it on the exercises when in the clinic doing the exercises.     Currently in Pain?  Yes    Pain Score  1     Pain Location  Back    Pain Orientation  Right;Left;Mid;Lower    Pain Descriptors / Indicators  Aching;Nagging    Pain Type  Chronic pain    Pain Onset  More than a month ago    Pain Frequency  Constant         [facility] PT Assessment - 10/27/17 0001      Assessment   Medical Diagnosis  mid back pain; LBP     Referring Provider  Dr Jake Pearson    Onset Date/Surgical Date  03/19/17    Hand Dominance  Left    Next MD Visit  PRN    Prior Therapy  none       Flexibility   Hamstrings  tight ~70 deg Rt 75 deg Lt     ITB  tight Lt > Rt     Piriformis  tight Lt > Rt                    [facility] Adult PT Treatment/Exercise - 10/27/17 0001      Lumbar Exercises: Stretches   Passive Hamstring Stretch  Right;Left;2 reps;30 seconds supine with strap     Lower Trunk Rotation  2 reps;10 seconds    ITB Stretch  Right;Left;2 reps;30 seconds supine with strap     Piriformis Stretch  Right;Left;2 reps;30 seconds supine travell       Lumbar Exercises: Aerobic   Nustep  L5 x 6 min LE only       Lumbar Exercises: Supine   Clam  10 reps green TB holding one LE still and moving the opposite     Bridge  10 reps;3 seconds    Other Supine Lumbar Exercises  3 part core 10 sec x 10 reps in hooklying position       Moist Heat Therapy   Number Minutes Moist Heat  15 Minutes    Moist Heat Location  Lumbar Spine thoracic       Electrical Stimulation   Electrical Stimulation Location  lumbar spine     Electrical Stimulation Action  TENS  Electrical Stimulation Parameters  to tolerance    Electrical Stimulation Goals  Pain;Tone             PT Education - 10/27/17 1611    Education Details  HEP     Person(s) Educated  Patient    Methods  Explanation;Demonstration;Tactile cues;Verbal cues;Handout    Comprehension  Verbalized understanding;Returned demonstration;Verbal cues required;Tactile cues required          PT Long Term Goals - 10/27/17 1541      PT LONG TERM GOAL #1   Title  Improve tissue extensibility; ROM; mobility through bilat LE's increasing HS flexibility by 5-10 degrees 11/30/17    Time  6    Period  Weeks    Status  On-going      PT LONG TERM GOAL #2   Title  Patient to demonstrate appropriate body mechanics for transitional movements 11/30/17    Time  6    Period  Weeks    Status  On-going      PT LONG TERM GOAL #3   Title  Decrease pain by 50-75% for functional activities per pt report 11/30/17    Time  6    Period  Weeks    Status  On-going      PT LONG TERM GOAL #4   Title  Independent in HEP 11/30/17    Time  6    Period  Weeks    Status  On-going      PT LONG TERM GOAL #5   Title  Improve FOTO to </= 27% limitation 11/30/17    Time  6    Period  Weeks    Status  On-going            Plan -  10/27/17 1549    Clinical Impression Statement  Patient returns with report of increased soreness for three days after eval and inital exercises. He has no significant difference in the mid and LBP. He has not done his exercises - "lost the papers". He added a few stabilization exercises today. Jake Pearson needs to exercises consistently to see improvement in chronic back pain.     Rehab Potential  Good    PT Frequency  1x / week    PT Duration  6 weeks    PT Treatment/Interventions  Patient/family education;ADLs/Self Care Home Management;Cryotherapy;Electrical Stimulation;Iontophoresis 27m/ml Dexamethasone;Moist Heat;Traction;Ultrasound;Dry needling;Manual techniques;Neuromuscular re-education;Therapeutic activities;Therapeutic exercise    PT Next Visit Plan  review HEP; add pec stretch, hip flexor stretch, lateral trunk flexion in sitting; core stabilization; modalities as indicated; spine care education/body mechnics     Consulted and Agree with Plan of Care  Patient       Patient will benefit from skilled therapeutic intervention in order to improve the following deficits and impairments:  Postural dysfunction, Improper body mechanics, Pain, Increased fascial restricitons, Increased muscle spasms, Decreased range of motion, Decreased mobility  Visit Diagnosis: Mid back pain  Chronic bilateral low back pain without sciatica  Stiffness due to immobility     Problem List Patient Active Problem List   Diagnosis Date Noted  . Mid back pain 10/13/2017  . Snoring 10/13/2017  . Nasal congestion 10/13/2017  . 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 Fatty liver 10/13/2017  . Arthritis of right acromioclavicular joint 07/22/2017  . Right lower quadrant abdominal pain 07/22/2017  . Hearing loss of left ear 11/17/2016  . Enlarged prostate 05/15/2013    Jake Pearson PNilda SimmerPT<MPH  10/27/2017, 4:18 PM  [facility]  [place], [place],  [address] Phone: [phone]   Fax:  [phone]  Name: Jake Pearson MRN: [id number] Date of Birth: [birth year]  PHYSICAL THERAPY DISCHARGE SUMMARY  Visits from Start of Care: 2  Current functional level related to goals / functional outcomes: See progress note for discharge status   Remaining deficits: Unknown - patient not consistent with HEP    Education / Equipment: HEP  Plan: Patient agrees to discharge.  Patient goals were partially met. Patient is being discharged due to not returning since the last visit.  ?????     Lisabeth Mian P. Helene Kelp PT, MPH 12/28/17 12:17 PM

## 2017-10-27 NOTE — Patient Instructions (Addendum)
Knee Roll    Lying on back, with knees bent and feet flat on floor, arms outstretched to sides, slowly roll both knees to side, hold 10 seconds. Back to starting position, hold 5 seconds. Then to opposite side, hold 10 seconds. Return to starting position. Keep shoulders and arms in contact with floor.   Bridging    Slowly raise buttocks from floor, keeping core tight. Repeat __10__ times per set. Do __1-2__ sets per session. Do _1___ sessions per day.   Strengthening: Hip Abductor - Resisted    With band looped around both legs above knees, hold one knee still and move the opposite knee out Repeat __10__ times per set. Do __1-3 __ sets per session. Do _1___ sessions per day.

## 2017-11-03 ENCOUNTER — Encounter: Payer: BC Managed Care – PPO | Admitting: Rehabilitative and Restorative Service Providers"

## 2017-11-07 ENCOUNTER — Encounter

## 2017-11-07 ENCOUNTER — Encounter: Payer: BC Managed Care – PPO | Admitting: Physical Therapy

## 2018-01-09 ENCOUNTER — Ambulatory Visit (INDEPENDENT_AMBULATORY_CARE_PROVIDER_SITE_OTHER): Payer: BC Managed Care – PPO | Admitting: Family Medicine

## 2018-01-09 ENCOUNTER — Ambulatory Visit (INDEPENDENT_AMBULATORY_CARE_PROVIDER_SITE_OTHER): Payer: BC Managed Care – PPO

## 2018-01-09 ENCOUNTER — Encounter: Payer: Self-pay | Admitting: Family Medicine

## 2018-01-09 VITALS — BP 121/86 | HR 62 | Ht 68.0 in | Wt 170.0 lb

## 2018-01-09 DIAGNOSIS — M2578 Osteophyte, vertebrae: Secondary | ICD-10-CM

## 2018-01-09 DIAGNOSIS — M545 Low back pain, unspecified: Secondary | ICD-10-CM

## 2018-01-09 DIAGNOSIS — I700 Atherosclerosis of aorta: Secondary | ICD-10-CM | POA: Diagnosis not present

## 2018-01-09 MED ORDER — PREDNISONE 20 MG PO TABS: 40.0000 mg | ORAL_TABLET | Freq: Every day | ORAL | 0 refills | Status: DC

## 2018-01-09 NOTE — Progress Notes (Signed)
   Subjective:    Patient ID: Jake Pearson, male    DOB: 02/25/1956, 62 y.o.   MRN: [id number]  HPI 62 year old male comes in today for acute low back pain for the last couple of months..  Had actually seen him back in June for mid back pain to the flank area for which she started physical therapy and did 2 sessions back in July.  He says it did help some but did not completely resolve his pain.  Today he is complaining more about right low back pain right where his belt comes across his pant waist.  He says is almost always focused on the right side occasionally a little bit on the left.  He says last week it was severe and actually was getting around towards the abdomen.  He denies any radiation down his leg.  He says sometimes it almost grabs like a muscle spasm.  He does have a muscle relaxer at home and says he forgot to even try it.  He rates his pain at a baseline of 3 out of 10 but at times it intensifies to an 8 out of 10.   Review of Systems     Objective:   Physical Exam  Constitutional: He is oriented to person, place, and time. He appears well-developed and well-nourished.  HENT:  Head: Normocephalic and atraumatic.  Eyes: Conjunctivae and EOM are normal.  Cardiovascular: Normal rate.  Pulmonary/Chest: Effort normal.  Musculoskeletal:  Decreased lumbar flexion though he says he is not very flexible.  Pain with extension.  Decreased rotation right and left about 25 degrees but symmetric.  Pain with side bending to the right but none to the left. Normal gait.   Neurological: He is alert and oriented to person, place, and time.  Skin: Skin is dry. No pallor.  Psychiatric: He has a normal mood and affect. His behavior is normal.  Vitals reviewed.       Assessment & Plan:  Right low back pain-I do think at this point it deserves further work-up.  His pain was a little bit higher on the right now a little bit lower but is still been persistent over the last couple of months.   Though he only went to 2 sessions of physical therapy.  We discussed a trial of prednisone and may be even try the muscle relaxer that he has at home.  We will get x-rays for further work-up.  If negative consider MRI for further work-up.  He may benefit from a repeat course of physical therapy that is truly extended for about 6 weeks or may be even injections if needed.  With results once available.  Note he did have a CT of the abdomen done in June which just showed a right sided bladder diverticulum and some fatty infiltration of the liver as well as a small left inguinal hernia but no other acute findings.

## 2018-01-12 ENCOUNTER — Ambulatory Visit (INDEPENDENT_AMBULATORY_CARE_PROVIDER_SITE_OTHER): Payer: BC Managed Care – PPO | Admitting: Sports Medicine

## 2018-01-12 ENCOUNTER — Encounter: Payer: Self-pay | Admitting: Sports Medicine

## 2018-01-12 DIAGNOSIS — M545 Low back pain, unspecified: Secondary | ICD-10-CM

## 2018-01-12 DIAGNOSIS — M5136 Other intervertebral disc degeneration, lumbar region: Secondary | ICD-10-CM | POA: Insufficient documentation

## 2018-01-12 DIAGNOSIS — M51369 Other intervertebral disc degeneration, lumbar region without mention of lumbar back pain or lower extremity pain: Secondary | ICD-10-CM | POA: Insufficient documentation

## 2018-01-12 LAB — POCT URINALYSIS DIPSTICK
Bilirubin, UA: NEGATIVE
Glucose, UA: POSITIVE — AB
Ketones, UA: NEGATIVE
Leukocytes, UA: NEGATIVE
Nitrite, UA: NEGATIVE
Protein, UA: NEGATIVE
Spec Grav, UA: >=1.03 — AB (ref 1.010–1.025)
Urobilinogen, UA: 0.2 E.U./dL
pH, UA: 5 (ref 5.0–8.0)

## 2018-01-12 MED ORDER — METHYLPREDNISOLONE SODIUM SUCC 125 MG IJ SOLR
125.0000 mg | Freq: Once | INTRAMUSCULAR | Status: AC
  Administered 2018-01-12: 125 mg via INTRAMUSCULAR

## 2018-01-12 MED ORDER — KETOROLAC TROMETHAMINE 30 MG/ML IJ SOLN
30.0000 mg | Freq: Once | INTRAMUSCULAR | Status: AC
  Administered 2018-01-12: 30 mg via INTRAMUSCULAR

## 2018-01-12 MED ORDER — TRAMADOL HCL 50 MG PO TABS: 50.0000 mg | ORAL_TABLET | Freq: Three times a day (TID) | ORAL | 0 refills | Status: DC | PRN

## 2018-01-12 NOTE — Addendum Note (Signed)
Addended by: Juel Burrow on: 01/12/2018 04:28 PM   Modules accepted: Orders

## 2018-01-12 NOTE — Assessment & Plan Note (Signed)
Occasional radiation around to the groin. Checking urinalysis, he does have a history of a right renal cyst as well as right-sided bladder diverticulum. Does have hematuria, adding urine culture. If loin to groin pain worsens we will get a CT looking for nephrolithiasis but I did not see anything radiopaque on his x-ray. Pain is axial, mostly discogenic and worse with sitting and flexion. Toradol 30, Solu-Medrol 125. Adding tramadol for acute pain relief and aggressive formal physical therapy. Return to see me in 1 month, MR for interventional planning if no better.

## 2018-01-12 NOTE — Progress Notes (Signed)
Subjective:    I'm seeing this patient as a consultation for: Dr. Nani Gasser  CC: Right-sided low back pain  HPI: This is a pleasant 62 year old male, for several weeks he is in pain that he localizes in the right side of his low back, occasional radiation to the groin, moderate, persistent.  No bowel or bladder dysfunction, saddle numbness, constitutional symptoms, prednisone is not kicked in yet.  He does have a history of a right renal cyst and right bladder diverticulum.  Nothing radicular down the leg.  No overt hematuria.  I reviewed the past medical history, family history, social history, surgical history, and allergies today and no changes were needed.  Please see the problem list section below in epic for further details.  Past Medical History: Past Medical History:  Diagnosis Date  . Enlarged prostate   . History of nephrolithiasis   . Mid back pain 10/13/2017   Past Surgical History: Past Surgical History:  Procedure Laterality Date  . ARTHROSCOPIC REPAIR ACL    . KNEE ARTHROCENTESIS    . TONSILLECTOMY  Age 42   . VASECTOMY     Social History: Social History   Socioeconomic History  . Marital status: Married    Spouse name: Aram Beecham  . Number of children: 2  . Years of education: Not on file  . Highest education level: Not on file  Occupational History    Employer: [employer]  Social Needs  . Financial resource strain: Not on file  . Food insecurity:    Worry: Not on file    Inability: Not on file  . Transportation needs:    Medical: Not on file    Non-medical: Not on file  Tobacco Use  . Smoking status: Never Smoker  . Smokeless tobacco: Never Used  Substance and Sexual Activity  . Alcohol use: Yes    Alcohol/week: 1.0 standard drinks    Types: 1 drink(s) per week  . Drug use: No  . Sexual activity: Yes    Partners: Female  Lifestyle  . Physical activity:    Days per week: Not on file    Minutes per session: Not on file  . Stress: Not  on file  Relationships  . Social connections:    Talks on phone: Not on file    Gets together: Not on file    Attends religious service: Not on file    Active member of club or organization: Not on file    Attends meetings of clubs or organizations: Not on file    Relationship status: Not on file  Other Topics Concern  . 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  Has 2 kids.    Family History: Family History  Problem Relation Age of Onset  . Diabetes Maternal Uncle   . Diabetes Maternal Aunt   . Heart attack Maternal Grandfather   . Heart attack Maternal Uncle    Allergies: No Known Allergies Medications: See med rec.  Review of Systems: No headache, visual changes, nausea, vomiting, diarrhea, constipation, dizziness, abdominal pain, skin rash, fevers, chills, night sweats, weight loss, swollen lymph nodes, body aches, joint swelling, muscle aches, chest pain, shortness of breath, mood changes, visual or auditory hallucinations.   Objective:   General: Well Developed, well nourished, and in no acute distress.  Neuro:  Extra-ocular muscles intact, able to move all 4 extremities, sensation grossly intact.  Deep tendon reflexes tested were normal. Psych:  Alert and oriented, mood congruent with affect. ENT:  Ears and nose appear unremarkable.  Hearing grossly normal. Neck: Unremarkable overall appearance, trachea midline.  No visible thyroid enlargement. Eyes: Conjunctivae and lids appear unremarkable.  Pupils equal and round. Skin: Warm and dry, no rashes noted.  Cardiovascular: Pulses palpable, no extremity edema. Back Exam:  Inspection: Unremarkable  Motion: Flexion 45 deg, Extension 45 deg, Side Bending to 45 deg bilaterally,  Rotation to 45 deg bilaterally  SLR laying: Negative  XSLR laying: Negative  Palpable tenderness: None. FABER: negative. Sensory change: Gross sensation intact to all lumbar and sacral dermatomes.  Reflexes: 2+ at  both patellar tendons, 2+ at achilles tendons, Babinski's downgoing.  Strength at foot  Plantar-flexion: 5/5 Dorsi-flexion: 5/5 Eversion: 5/5 Inversion: 5/5  Leg strength  Quad: 5/5 Hamstring: 5/5 Hip flexor: 5/5 Hip abductors: 5/5  Gait unremarkable.  Urinalysis positive for blood, this however can be secondary to his cyst.  Solu-Medrol 125, Toradol 30 given intramuscular.  Impression and Recommendations:   This case required medical decision making of moderate complexity.  Acute right-sided low back pain without sciatica Occasional radiation around to the groin. Checking urinalysis, he does have a history of a right renal cyst as well as right-sided bladder diverticulum. Does have hematuria, adding urine culture. If loin to groin pain worsens we will get a CT looking for nephrolithiasis but I did not see anything radiopaque on his x-ray. Pain is axial, mostly discogenic and worse with sitting and flexion. Toradol 30, Solu-Medrol 125. Adding tramadol for acute pain relief and aggressive formal physical therapy. Return to see me in 1 month, MR for interventional planning if no better. ___________________________________________ Ihor Austin. Benjamin Stain, M.D., ABFM., CAQSM. Primary Care and Sports Medicine [facility]  Adjunct Instructor of Family Medicine  [employer]

## 2018-01-12 NOTE — Addendum Note (Signed)
Addended by: Donne Anon L on: 01/12/2018 04:12 PM   Modules accepted: Orders

## 2018-01-14 LAB — URINE CULTURE
MICRO NUMBER:: 91159004
Result:: NO GROWTH
SPECIMEN QUALITY:: ADEQUATE

## 2018-01-19 ENCOUNTER — Ambulatory Visit (INDEPENDENT_AMBULATORY_CARE_PROVIDER_SITE_OTHER): Payer: BC Managed Care – PPO | Admitting: Physical Therapy

## 2018-01-19 ENCOUNTER — Encounter: Payer: Self-pay | Admitting: Physical Therapy

## 2018-01-19 DIAGNOSIS — M545 Low back pain, unspecified: Secondary | ICD-10-CM

## 2018-01-19 DIAGNOSIS — G8929 Other chronic pain: Secondary | ICD-10-CM | POA: Diagnosis not present

## 2018-01-19 NOTE — Patient Instructions (Signed)
Access Code: [email or web address]  URL: [email or web address]  Date: 01/19/2018  Prepared by: Moshe Cipro   Exercises  Supine Hamstring Stretch with Strap - 3 reps - 1 sets - 30 sec hold - 2x daily - 7x weekly  Supine Piriformis Stretch with Foot on Ground - 3 reps - 1 sets - 30 sec hold - 2x daily - 7x weekly  Hooklying Single Knee to Chest - 3 reps - 1 sets - 30 sec hold - 2x daily - 7x weekly  Supine Posterior Pelvic Tilt - 10 reps - 1 sets - 5 sec hold - 2x daily - 7x weekly  Patient Education  Trigger Point Dry Needling

## 2018-01-19 NOTE — Therapy (Signed)
[facility] [address] [place], [place], [address] Phone: [phone]   Fax:  [phone]  Physical Therapy Evaluation  Patient Details  Name: Jake Pearson MRN: [id number] Date of Birth: 1955/11/10 Referring Provider (PT): Monica Becton, MD   Encounter Date: 01/19/2018  PT End of Session - 01/19/18 1626    Visit Number  1    Number of Visits  12    Date for PT Re-Evaluation  03/02/18    Authorization Type  [employer]    PT Start Time  1532    PT Stop Time  1615    PT Time Calculation (min)  43 min    Activity Tolerance  Patient tolerated treatment well    Behavior During Therapy  [facility] for tasks assessed/performed       Past Medical History:  Diagnosis Date  . Enlarged prostate   . History of nephrolithiasis   . Mid back pain 10/13/2017    Past Surgical History:  Procedure Laterality Date  . ARTHROSCOPIC REPAIR ACL    . KNEE ARTHROCENTESIS    . TONSILLECTOMY  Age 89   . VASECTOMY      There were no vitals filed for this visit.   Subjective Assessment - 01/19/18 1533    Subjective  Pt is a 62 y/o male who presents to OPPT for LBP.  Pt seen by PT in July for 2 visits, and feels it was minimally beneficial.  Pt then stopped coming to PT, and reports doing exercises, but overall pain minimally improved.  Pt then with exacerbation about 2 weeks ago and hasn't noticed improvement.  Pt referred back to PT and dx with "herniated disc."    Limitations  Sitting;Walking    How long can you sit comfortably?  10-15 min    Patient Stated Goals  improve pain; sleep; perform LB dressing without pain    Currently in Pain?  Yes    Pain Score  2    up to 8/10; at best 2/10   Pain Location  Back    Pain Orientation  Lower;Right    Pain Descriptors / Indicators  Cramping;Spasm    Pain Type  Acute pain;Chronic pain    Pain Radiating Towards  into Rt groin    Pain Onset  1 to 4 weeks ago    Pain  Frequency  Constant    Aggravating Factors   leaning backwards, stepping with twisting motion, coughing    Pain Relieving Factors  rest         [facility] PT Assessment - 01/19/18 1539      Assessment   Medical Diagnosis  M54.5 (ICD-10-CM) - Acute right-sided low back pain without sciatica    Referring Provider (PT)  Monica Becton, MD    Onset Date/Surgical Date  --   acute on chronic(2wks; initial pain May 2019)   Next MD Visit  02/09/18    Prior Therapy  2 visits in July      Precautions   Precautions  None      Restrictions   Weight Bearing Restrictions  No      Balance Screen   Has the patient fallen in the past 6 months  No    Has the patient had a decrease in activity level because of a fear of falling?   No    Is the patient reluctant to leave their home because of a fear of falling?   No  Home Environment   Living Environment  Private residence    Living Arrangements  Alone;Children    Type of Home  House    Home Access  Stairs to enter    Home Layout  Two level    Alternate Level Stairs-Number of Steps  17    Alternate Level Stairs-Rails  Left    Additional Comments  denies difficulty with stairs      Prior Function   Level of Independence  Independent    Vocation  Full time employment    Lobbyist    Leisure  magic, hiking, Environmental manager      Cognition   Overall Cognitive Status  Within Functional Limits for tasks assessed      Observation/Other Assessments   Focus on Therapeutic Outcomes (FOTO)   67 (33% limited; predicted 23% limited)      Posture/Postural Control   Posture/Postural Control  Postural limitations    Postural Limitations  Decreased lumbar lordosis      ROM / Strength   AROM / PROM / Strength  AROM;Strength      AROM   AROM Assessment Site  Lumbar    Lumbar Flexion  limited 50%    Lumbar Extension  limited 25%   unable to reprodue pain today   Lumbar - Right Side Bend  limited 25%    Lumbar - Left  Side Bend  WNL    Lumbar - Right Rotation  limited 25%; mild pain    Lumbar - Left Rotation  WNL      Strength   Strength Assessment Site  Hip;Knee;Ankle    Right/Left Hip  Right;Left    Right Hip Flexion  5/5    Right Hip Extension  4/5    Right Hip ABduction  4/5    Left Hip Flexion  5/5    Left Hip Extension  4/5    Left Hip ABduction  4/5    Right/Left Knee  Right;Left    Right Knee Flexion  5/5    Right Knee Extension  5/5    Left Knee Flexion  5/5    Left Knee Extension  5/5    Right/Left Ankle  Right;Left    Right Ankle Dorsiflexion  5/5    Left Ankle Dorsiflexion  5/5      Flexibility   Soft Tissue Assessment /Muscle Length  yes    Hamstrings  tightness Rt>Lt    Piriformis  tightness Rt>Lt      Palpation   Spinal mobility  hypomobility with CPA L2-5    Palpation comment  active trigger points in Rt glute med/max noted                Objective measurements completed on examination: See above findings.      [facility] Adult PT Treatment/Exercise - 01/19/18 1539      Self-Care   Self-Care  Other Self-Care Comments    Other Self-Care Comments   moving wallet to front pocket when sitting      Exercises   Exercises  Lumbar      Lumbar Exercises: Stretches   Passive Hamstring Stretch  Right;1 rep;30 seconds   for HEP instruction   Single Knee to Chest Stretch  Right;1 rep;30 seconds   for HEP instruction   Piriformis Stretch  Right;1 rep;30 seconds   for HEP instruction     Lumbar Exercises: Supine   Ab Set  5 reps;5 seconds   for HEP instruction  PT Education - 01/19/18 1626    Education Details  HEP, DN    Person(s) Educated  Patient    Methods  Explanation;Demonstration;Handout    Comprehension  Verbalized understanding;Need further instruction;Returned demonstration          PT Long Term Goals - 01/19/18 1633      PT LONG TERM GOAL #1   Title  independent with HEP    Status  New    Target Date  03/02/18      PT  LONG TERM GOAL #2   Title  report ability to sit > 30 min without increase in pain for improved work related activities    Status  New    Target Date  03/02/18      PT LONG TERM GOAL #3   Title  report pain < 4/10 with activity for improved function    Status  New    Target Date  03/02/18      PT LONG TERM GOAL #4   Title  lumbar flexion improved to </= 25% limited for improved flexibility and decreased pain    Status  New    Target Date  03/02/18      PT LONG TERM GOAL #5   Title  FOTO score improved to </= 25% limited for improved function    Status  New    Target Date  03/02/18             Plan - 01/19/18 1627    Clinical Impression Statement  Pt is a 62 y/o male who presents to OPPT for LBP.  Pt seen at this clinic in July 2019 for same condition, but only completed 2 sessions.  Pt then reports pain improved, but with 2 week exacerbation of pain.  Pt demonstrates decreased ROM and flexibility as well as active trigger points in glutes.  Pt will benefit from PT to address deficits listed.     Clinical Presentation  Stable    Clinical Decision Making  Low    Rehab Potential  Good    PT Frequency  2x / week    PT Duration  6 weeks    PT Treatment/Interventions  ADLs/Self Care Home Management;Cryotherapy;Electrical Stimulation;Ultrasound;Traction;Moist Heat;Functional mobility training;Therapeutic activities;Therapeutic exercise;Neuromuscular re-education;Patient/family education;Manual techniques;Dry needling;Taping    PT Next Visit Plan  review HEP, manual/modalities/DN, core/hip stability    PT Home Exercise Plan  Access Code: [email or web address]    Consulted and Agree with Plan of Care  Patient       Patient will benefit from skilled therapeutic intervention in order to improve the following deficits and impairments:  Increased fascial restricitons, Increased muscle spasms, Pain, Postural dysfunction, Decreased range of motion, Hypomobility, Impaired flexibility, Decreased  strength  Visit Diagnosis: Chronic bilateral low back pain without sciatica - Plan: PT plan of care cert/re-cert     Problem List Patient Active Problem List   Diagnosis Date Noted  . Acute right-sided low back pain without sciatica 01/12/2018  . Snoring 10/13/2017  . Nasal congestion 10/13/2017  . 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 Fatty liver 10/13/2017  . Arthritis of right acromioclavicular joint 07/22/2017  . Right lower quadrant abdominal pain 07/22/2017  . Hearing loss of left ear 11/17/2016  . Enlarged prostate 05/15/2013      Clarita Crane, PT, DPT 01/19/18 4:37 PM    [facility] [address] [place], [place], [address] Phone: [phone]   Fax:  [phone]  Name: Trevontae Lindahl MRN: [id number]  Date of Birth: [birth year]

## 2018-01-25 ENCOUNTER — Encounter: Payer: BC Managed Care – PPO | Admitting: Physical Therapy

## 2018-02-01 ENCOUNTER — Encounter: Payer: Self-pay | Admitting: Rehabilitative and Restorative Service Providers"

## 2018-02-01 ENCOUNTER — Ambulatory Visit: Payer: BC Managed Care – PPO | Admitting: Rehabilitative and Restorative Service Providers"

## 2018-02-01 DIAGNOSIS — G8929 Other chronic pain: Secondary | ICD-10-CM

## 2018-02-01 DIAGNOSIS — M2560 Stiffness of unspecified joint, not elsewhere classified: Secondary | ICD-10-CM

## 2018-02-01 DIAGNOSIS — M545 Low back pain, unspecified: Secondary | ICD-10-CM

## 2018-02-01 DIAGNOSIS — Z7409 Other reduced mobility: Secondary | ICD-10-CM

## 2018-02-01 DIAGNOSIS — M549 Dorsalgia, unspecified: Secondary | ICD-10-CM | POA: Diagnosis not present

## 2018-02-01 NOTE — Therapy (Signed)
[facility] [place] [address] [place], [place], [address] Phone: [phone]   Fax:  [phone]  Physical Therapy Treatment  Patient Details  Name: Jake Pearson MRN: [id number] Date of Birth: Apr 21, 1955 Referring Provider (PT): Monica Becton, MD   Encounter Date: 02/01/2018  PT End of Session - 02/01/18 1531    Visit Number  2    Number of Visits  12    Date for PT Re-Evaluation  03/02/18    Authorization Type  [employer]    PT Start Time  1526    PT Stop Time  1613    PT Time Calculation (min)  47 min    Activity Tolerance  Patient tolerated treatment well       Past Medical History:  Diagnosis Date  . Enlarged prostate   . History of nephrolithiasis   . Mid back pain 10/13/2017    Past Surgical History:  Procedure Laterality Date  . ARTHROSCOPIC REPAIR ACL    . KNEE ARTHROCENTESIS    . TONSILLECTOMY  Age 62   . VASECTOMY      There were no vitals filed for this visit.  Subjective Assessment - 02/01/18 1532    Subjective  Patient reports that he has missed a couple of days with his exercises but has done them "most days".     Currently in Pain?  Yes    Pain Score  2     Pain Location  Back    Pain Orientation  Lower;Right                       [facility] Adult PT Treatment/Exercise - 02/01/18 0001      Lumbar Exercises: Stretches   Passive Hamstring Stretch  Right;1 rep;30 seconds   for HEP instruction   Single Knee to Chest Stretch  Right;1 rep;30 seconds   for HEP instruction   Piriformis Stretch  Right;1 rep;30 seconds   for HEP instruction     Lumbar Exercises: Supine   Ab Set  5 reps;5 seconds   for HEP instruction   Bridge  10 reps;3 seconds      Manual Therapy   Manual therapy comments  pt prone     Soft tissue mobilization  deep tissue work through the Rt posterior hip - glut min/med; glut max; piriformis     Myofascial Release  posterior Rt hip         Trigger Point Dry Needling - 02/01/18 1548    Consent Given?  Yes    Education Handout Provided  Yes    Muscles Treated Lower Body  --   Rt posterior hip/buttocks    Gluteus Maximus Response  Palpable increased muscle length    Gluteus Minimus Response  Palpable increased muscle length    Piriformis Response  Palpable increased muscle length    Tensor Fascia Lata Response  Palpable increased muscle length           PT Education - 02/01/18 1602    Education Details  HEP     Person(s) Educated  Patient    Methods  Explanation;Demonstration;Tactile cues;Verbal cues;Handout    Comprehension  Verbalized understanding;Returned demonstration;Verbal cues required;Tactile cues required          PT Long Term Goals - 01/19/18 1633      PT LONG TERM GOAL #1   Title  independent with HEP    Status  New    Target Date  03/02/18      PT LONG TERM GOAL #2   Title  report ability to sit > 30 min without increase in pain for improved work related activities    Status  New    Target Date  03/02/18      PT LONG TERM GOAL #3   Title  report pain < 4/10 with activity for improved function    Status  New    Target Date  03/02/18      PT LONG TERM GOAL #4   Title  lumbar flexion improved to </= 25% limited for improved flexibility and decreased pain    Status  New    Target Date  03/02/18      PT LONG TERM GOAL #5   Title  FOTO score improved to </= 25% limited for improved function    Status  New    Target Date  03/02/18            Plan - 02/01/18 1532    Clinical Impression Statement  Patient arrived late for appt - reviewed HEP. Patient tolerated DN Rt glut min/med area follewed by manual work and modalities.     Rehab Potential  Good    PT Frequency  2x / week    PT Duration  6 weeks    PT Treatment/Interventions  ADLs/Self Care Home Management;Cryotherapy;Electrical Stimulation;Ultrasound;Traction;Moist Heat;Functional mobility training;Therapeutic  activities;Therapeutic exercise;Neuromuscular re-education;Patient/family education;Manual techniques;Dry needling;Taping    PT Next Visit Plan  review HEP, manual/modalities/DN, core/hip stability assess response to DN     PT Home Exercise Plan  Access Code: [email or web address]    Consulted and Agree with Plan of Care  Patient       Patient will benefit from skilled therapeutic intervention in order to improve the following deficits and impairments:  Increased fascial restricitons, Increased muscle spasms, Pain, Postural dysfunction, Decreased range of motion, Hypomobility, Impaired flexibility, Decreased strength  Visit Diagnosis: Chronic bilateral low back pain without sciatica  Mid back pain  Stiffness due to immobility     Problem List Patient Active Problem List   Diagnosis Date Noted  . Acute right-sided low back pain without sciatica 01/12/2018  . Snoring 10/13/2017  . Nasal congestion 10/13/2017  . 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 Fatty liver 10/13/2017  . Arthritis of right acromioclavicular joint 07/22/2017  . Right lower quadrant abdominal pain 07/22/2017  . Hearing loss of left ear 11/17/2016  . Enlarged prostate 05/15/2013    Brantley Wiley Rober Minion PT, MPH  02/01/2018, 4:05 PM  [facility] [address] [place], [place], [address] Phone: [phone]   Fax:  [phone]  Name: Jake Pearson MRN: [id number] Date of Birth: Nov 01, 1955

## 2018-02-01 NOTE — Patient Instructions (Signed)
Bridging    Slowly raise buttocks from floor, keeping stomach tight. Repeat _10___ times per set. Do __1-2__ sets per session. Do __1-2__ sessions per day.

## 2018-02-06 ENCOUNTER — Encounter: Payer: BC Managed Care – PPO | Admitting: Physical Therapy

## 2018-02-09 ENCOUNTER — Encounter: Payer: BC Managed Care – PPO | Admitting: Physical Therapy

## 2018-02-09 ENCOUNTER — Encounter: Payer: Self-pay | Admitting: Sports Medicine

## 2018-02-09 ENCOUNTER — Ambulatory Visit (INDEPENDENT_AMBULATORY_CARE_PROVIDER_SITE_OTHER): Payer: BC Managed Care – PPO | Admitting: Sports Medicine

## 2018-02-09 DIAGNOSIS — M4807 Spinal stenosis, lumbosacral region: Secondary | ICD-10-CM

## 2018-02-09 DIAGNOSIS — M545 Low back pain, unspecified: Secondary | ICD-10-CM

## 2018-02-09 MED ORDER — TRAMADOL HCL 50 MG PO TABS: 100.0000 mg | ORAL_TABLET | Freq: Three times a day (TID) | ORAL | 1 refills | Status: DC | PRN

## 2018-02-09 NOTE — Progress Notes (Signed)
Subjective:    CC: Follow-up  HPI: Jake Pearson returns, continues to have axial low back pain, worse with sitting, flexion, Valsalva.  No bowel or bladder dysfunction, saddle numbness, constitutional symptoms.  Has done formal physical therapy, but failed greater than 6 weeks of physician directed conservative measures.  I reviewed the past medical history, family history, social history, surgical history, and allergies today and no changes were needed.  Please see the problem list section below in epic for further details.  Past Medical History: Past Medical History:  Diagnosis Date  . Enlarged prostate   . History of nephrolithiasis   . Mid back pain 10/13/2017   Past Surgical History: Past Surgical History:  Procedure Laterality Date  . ARTHROSCOPIC REPAIR ACL    . KNEE ARTHROCENTESIS    . TONSILLECTOMY  Age 62   . VASECTOMY     Social History: Social History   Socioeconomic History  . Marital status: Married    Spouse name: Aram Beecham  . Number of children: 2  . Years of education: Not on file  . Highest education level: Not on file  Occupational History    Employer: [employer]  Social Needs  . Financial resource strain: Not on file  . Food insecurity:    Worry: Not on file    Inability: Not on file  . Transportation needs:    Medical: Not on file    Non-medical: Not on file  Tobacco Use  . Smoking status: Never Smoker  . Smokeless tobacco: Never Used  Substance and Sexual Activity  . Alcohol use: Yes    Alcohol/week: 1.0 standard drinks    Types: 1 drink(s) per week  . Drug use: No  . Sexual activity: Yes    Partners: Female  Lifestyle  . Physical activity:    Days per week: Not on file    Minutes per session: Not on file  . Stress: Not on file  Relationships  . Social connections:    Talks on phone: Not on file    Gets together: Not on file    Attends religious service: Not on file    Active member of club or organization: Not on file    Attends  meetings of clubs or organizations: Not on file    Relationship status: Not on file  Other Topics Concern  . 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  Has 2 kids.    Family History: Family History  Problem Relation Age of Onset  . Diabetes Maternal Uncle   . Diabetes Maternal Aunt   . Heart attack Maternal Grandfather   . Heart attack Maternal Uncle    Allergies: No Known Allergies Medications: See med rec.  Review of Systems: No fevers, chills, night sweats, weight loss, chest pain, or shortness of breath.   Objective:    General: Well Developed, well nourished, and in no acute distress.  Neuro: Alert and oriented x3, extra-ocular muscles intact, sensation grossly intact.  HEENT: Normocephalic, atraumatic, pupils equal round reactive to light, neck supple, no masses, no lymphadenopathy, thyroid nonpalpable.  Skin: Warm and dry, no rashes. Cardiac: Regular rate and rhythm, no murmurs rubs or gallops, no lower extremity edema.  Respiratory: Clear to auscultation bilaterally. Not using accessory muscles, speaking in full sentences.  Impression and Recommendations:    Acute right-sided low back pain without sciatica Axial right-sided discogenic back pain. Not much better with steroids, tramadol, physical therapy. Proceeding to MRI  for interventional planning. He will increase his tramadol to 2 tabs 3 times daily. Return to go over MRI results and for interventional planning. ___________________________________________ Ihor Austin. Benjamin Stain, M.D., ABFM., CAQSM. Primary Care and Sports Medicine [facility]  Adjunct Professor of Family Medicine  [employer]

## 2018-02-09 NOTE — Assessment & Plan Note (Signed)
Axial right-sided discogenic back pain. Not much better with steroids, tramadol, physical therapy. Proceeding to MRI for interventional planning. He will increase his tramadol to 2 tabs 3 times daily. Return to go over MRI results and for interventional planning.

## 2018-02-16 ENCOUNTER — Ambulatory Visit (INDEPENDENT_AMBULATORY_CARE_PROVIDER_SITE_OTHER): Payer: BC Managed Care – PPO | Admitting: Rehabilitative and Restorative Service Providers"

## 2018-02-16 ENCOUNTER — Encounter: Payer: Self-pay | Admitting: Rehabilitative and Restorative Service Providers"

## 2018-02-16 DIAGNOSIS — M2560 Stiffness of unspecified joint, not elsewhere classified: Secondary | ICD-10-CM

## 2018-02-16 DIAGNOSIS — M545 Low back pain, unspecified: Secondary | ICD-10-CM

## 2018-02-16 DIAGNOSIS — M549 Dorsalgia, unspecified: Secondary | ICD-10-CM | POA: Diagnosis not present

## 2018-02-16 DIAGNOSIS — G8929 Other chronic pain: Secondary | ICD-10-CM | POA: Diagnosis not present

## 2018-02-16 DIAGNOSIS — Z7409 Other reduced mobility: Secondary | ICD-10-CM

## 2018-02-16 NOTE — Patient Instructions (Addendum)
Cat / Cow Flow    Inhale, press spine toward ceiling like a Halloween cat. Keeping strength in arms and abdominals, exhale to soften spine through neutral and into cow pose. Open chest and arch back. Initiate movement between cat and cow at tailbone, one vertebrae at a time. Repeat _10___ times.   Sitting with feet flat on the floor back up straight roll pelvis back pause, then arch back up straight Keep shoulders directly over hips Repeat 5-10 times Try this when driving, sitting at computer  Opposite Arm / Leg Lift (Prone)    Abdomen and head supported, left knee locked, raise leg and opposite arm __a few __ inches from floor. Repeat __5-10__ times per set. Do __1__ sessions per day.

## 2018-02-16 NOTE — Therapy (Addendum)
[facility] [address] [place], [place], [address] Phone: [phone]   Fax:  [phone]  Physical Therapy Treatment  Patient Details  Name: Burlin Mcnair MRN: [id number] Date of Birth: 11/30/1955 Referring Provider (PT): Silverio Decamp, MD   Encounter Date: 02/16/2018  PT End of Session - 02/16/18 1537    Visit Number  3    Number of Visits  12    Date for PT Re-Evaluation  03/02/18    PT Start Time  1532    PT Stop Time  1602    PT Time Calculation (min)  30 min    Activity Tolerance  Patient tolerated treatment well       Past Medical History:  Diagnosis Date  . Enlarged prostate   . History of nephrolithiasis   . Mid back pain 10/13/2017    Past Surgical History:  Procedure Laterality Date  . ARTHROSCOPIC REPAIR ACL    . KNEE ARTHROCENTESIS    . TONSILLECTOMY  Age 62   . VASECTOMY      There were no vitals filed for this visit.  Subjective Assessment - 02/16/18 1538    Subjective  Patient reports that he was really sore from the DN for several days and does not think that the DN helped. He was on vacation this past week and did some hiking with a couple of "jolts' through his spine that hurt for a minute then back was OK. Did not do much with his exercises. Lost his belt for stretching. Back has felt good this week.     Currently in Pain?  No/denies         [facility] PT Assessment - 02/16/18 0001      Assessment   Medical Diagnosis  M54.5 (ICD-10-CM) - Acute right-sided low back pain without sciatica    Referring Provider (PT)  Silverio Decamp, MD    Onset Date/Surgical Date  --   acute on chronic(2wks; initial pain May 2019)   Prior Therapy  2 visits in July      AROM   Lumbar Flexion  limited 30%    Lumbar Extension  limited 20%    Lumbar - Right Side Bend  limited 10%    Lumbar - Left Side Bend  limited 20%    Lumbar - Right Rotation  limited 20%    Lumbar - Left Rotation   limited 20%      Strength   Right Hip Flexion  5/5    Right Hip Extension  4+/5    Right Hip ABduction  5/5    Left Hip Flexion  5/5    Left Hip Extension  4+/5    Left Hip ABduction  5/5      Flexibility   Hamstrings  tightness Rt>Lt    Piriformis  tightness Rt>Lt                   [facility] Adult PT Treatment/Exercise - 02/16/18 0001      Lumbar Exercises: Seated   Other Seated Lumbar Exercises  anterior/posterior pelvic tilt keeping shoulders over hips x 10 (to increased lumbar spine mobility      Lumbar Exercises: Supine   Ab Set  5 reps;5 seconds   for HEP instruction   Clam  10 reps    Clam Limitations  holding one LE still moving opposite; green TB; 2-3 sec pause; 10 reps - core tight     Bridge  10 reps;3 seconds  Lumbar Exercises: Prone   Opposite Arm/Leg Raise  Right arm/Left leg;Left arm/Right leg;5 reps      Lumbar Exercises: Quadruped   Madcat/Old Horse  10 reps             PT Education - 02/16/18 1601    Education Details  HEP     Person(s) Educated  Patient    Methods  Explanation;Demonstration;Tactile cues;Verbal cues;Handout    Comprehension  Verbalized understanding;Returned demonstration;Verbal cues required;Tactile cues required          PT Long Term Goals - 02/16/18 1617      PT LONG TERM GOAL #1   Title  independent with HEP    Time  6    Period  Weeks    Status  Partially Met      PT LONG TERM GOAL #2   Title  report ability to sit > 30 min without increase in pain for improved work related activities    Time  6    Period  Weeks    Status  On-going      PT LONG TERM GOAL #3   Title  report pain < 4/10 with activity for improved function    Time  6    Period  Weeks    Status  Partially Met      PT LONG TERM GOAL #4   Title  lumbar flexion improved to </= 25% limited for improved flexibility and decreased pain    Time  6    Period  Weeks    Status  Achieved      PT LONG TERM GOAL #5   Title  FOTO score  improved to </= 25% limited for improved function    Time  6    Period  Weeks    Status  On-going            Plan - 02/16/18 1613    Clinical Impression Statement  --       Patient will benefit from skilled therapeutic intervention in order to improve the following deficits and impairments:  Increased fascial restricitons, Increased muscle spasms, Pain, Postural dysfunction, Decreased range of motion, Hypomobility, Impaired flexibility, Decreased strength  Visit Diagnosis: Chronic bilateral low back pain without sciatica  Mid back pain  Stiffness due to immobility     Problem List Patient Active Problem List   Diagnosis Date Noted  . Acute right-sided low back pain without sciatica 01/12/2018  . Snoring 10/13/2017  . Nasal congestion 10/13/2017  . 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 Fatty liver 10/13/2017  . Arthritis of right acromioclavicular joint 07/22/2017  . Right lower quadrant abdominal pain 07/22/2017  . Hearing loss of left ear 11/17/2016  . Enlarged prostate 05/15/2013    Maddisyn Hegwood Nilda Simmer PT, MPH  02/16/2018, 4:20 PM  [facility]  [place] [place], [place], [address] Phone: [phone]   Fax:  [phone]  Name: Zamari Vea MRN: [id number] Date of Birth: 1955/10/23  PHYSICAL THERAPY DISCHARGE SUMMARY  Visits from Start of Care: 3  Current functional level related to goals / functional outcomes: See progress note for discharge status    Remaining deficits: Unknown    Education / Equipment: HEP  Plan: Patient agrees to discharge.  Patient goals were partially met. Patient is being discharged due to not returning since the last visit.  ?????     Raynah Gomes P. Helene Kelp PT, MPH 03/28/18 12:44 PM

## 2018-03-06 ENCOUNTER — Ambulatory Visit (INDEPENDENT_AMBULATORY_CARE_PROVIDER_SITE_OTHER): Payer: BC Managed Care – PPO

## 2018-03-06 DIAGNOSIS — M4807 Spinal stenosis, lumbosacral region: Secondary | ICD-10-CM

## 2018-03-06 DIAGNOSIS — M5126 Other intervertebral disc displacement, lumbar region: Secondary | ICD-10-CM | POA: Diagnosis not present

## 2018-03-06 DIAGNOSIS — M545 Low back pain, unspecified: Secondary | ICD-10-CM

## 2018-03-21 ENCOUNTER — Ambulatory Visit: Payer: BC Managed Care – PPO | Admitting: Sports Medicine

## 2018-03-24 ENCOUNTER — Encounter: Payer: Self-pay | Admitting: Sports Medicine

## 2018-03-24 ENCOUNTER — Ambulatory Visit (INDEPENDENT_AMBULATORY_CARE_PROVIDER_SITE_OTHER): Payer: BC Managed Care – PPO | Admitting: Sports Medicine

## 2018-03-24 DIAGNOSIS — M5136 Other intervertebral disc degeneration, lumbar region: Secondary | ICD-10-CM

## 2018-03-24 DIAGNOSIS — M51369 Other intervertebral disc degeneration, lumbar region without mention of lumbar back pain or lower extremity pain: Secondary | ICD-10-CM

## 2018-03-24 NOTE — Progress Notes (Signed)
Subjective:    CC: MRI results  HPI: I have been seeing Jaz for axial, discogenic low back pain without radiculopathy.  He did not respond initially to steroids, tramadol, formal physical therapy.  We obtained an MRI.  Since the MRI was done his pain has improved considerably.  He has not really had to take much tramadol.  I reviewed the past medical history, family history, social history, surgical history, and allergies today and no changes were needed.  Please see the problem list section below in epic for further details.  Past Medical History: Past Medical History:  Diagnosis Date  . Enlarged prostate   . History of nephrolithiasis   . Mid back pain 10/13/2017   Past Surgical History: Past Surgical History:  Procedure Laterality Date  . ARTHROSCOPIC REPAIR ACL    . KNEE ARTHROCENTESIS    . TONSILLECTOMY  Age 17   . VASECTOMY     Social History: Social History   Socioeconomic History  . Marital status: Married    Spouse name: Aram Beecham  . Number of children: 2  . Years of education: Not on file  . Highest education level: Not on file  Occupational History    Employer: [employer]  Social Needs  . Financial resource strain: Not on file  . Food insecurity:    Worry: Not on file    Inability: Not on file  . Transportation needs:    Medical: Not on file    Non-medical: Not on file  Tobacco Use  . Smoking status: Never Smoker  . Smokeless tobacco: Never Used  Substance and Sexual Activity  . Alcohol use: Yes    Alcohol/week: 1.0 standard drinks    Types: 1 drink(s) per week  . Drug use: No  . Sexual activity: Yes    Partners: Female  Lifestyle  . Physical activity:    Days per week: Not on file    Minutes per session: Not on file  . Stress: Not on file  Relationships  . Social connections:    Talks on phone: Not on file    Gets together: Not on file    Attends religious service: Not on file    Active member of club or organization: Not on file   Attends meetings of clubs or organizations: Not on file    Relationship status: Not on file  Other Topics Concern  . 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    Family History: Family History  Problem Relation Age of Onset  . Diabetes Maternal Uncle   . Diabetes Maternal Aunt   . Heart attack Maternal Grandfather   . Heart attack Maternal Uncle    Allergies: No Known Allergies Medications: See med rec.  Review of Systems: No fevers, chills, night sweats, weight loss, chest pain, or shortness of breath.   Objective:    General: Well Developed, well nourished, and in no acute distress.  Neuro: Alert and oriented x3, extra-ocular muscles intact, sensation grossly intact.  HEENT: Normocephalic, atraumatic, pupils equal round reactive to light, neck supple, no masses, no lymphadenopathy, thyroid nonpalpable.  Skin: Warm and dry, no rashes. Cardiac: Regular rate and rhythm, no murmurs rubs or gallops, no lower extremity edema.  Respiratory: Clear to auscultation bilaterally. Not using accessory muscles, speaking in full sentences.  MRI reviewed, multilevel lumbar DDD, mild with mild central and no foraminal stenosis worse at L4-L5.  There is also  some extremely mild lower lumbar facet arthropathy.  Impression and Recommendations:    Lumbar degenerative disc disease Axial right-sided discogenic back pain is for the most part gone now after a course of steroids, physical therapy, tramadol. He has not had to use very much of his tramadol. At the last visit he was still having significant pain so we proceeded with an MRI for interventional planning, however it has for the most part resolved, he will keep his core strong and return to see me as needed. ___________________________________________ Ihor Austinhomas J. Benjamin Stainhekkekandam, M.D., ABFM., CAQSM. Primary Care and Sports Medicine [facility]  Adjunct Professor of Family  Medicine  [employer]

## 2018-03-24 NOTE — Assessment & Plan Note (Signed)
Axial right-sided discogenic back pain is for the most part gone now after a course of steroids, physical therapy, tramadol. He has not had to use very much of his tramadol. At the last visit he was still having significant pain so we proceeded with an MRI for interventional planning, however it has for the most part resolved, he will keep his core strong and return to see me as needed.

## 2018-11-07 ENCOUNTER — Encounter: Payer: Self-pay | Admitting: Physician Assistant

## 2018-11-07 ENCOUNTER — Ambulatory Visit (INDEPENDENT_AMBULATORY_CARE_PROVIDER_SITE_OTHER): Payer: BC Managed Care – PPO | Admitting: Physician Assistant

## 2018-11-07 VITALS — BP 138/89 | HR 70 | Temp 97.6°F | Ht 68.0 in | Wt 170.0 lb

## 2018-11-07 DIAGNOSIS — J0140 Acute pansinusitis, unspecified: Secondary | ICD-10-CM | POA: Diagnosis not present

## 2018-11-07 DIAGNOSIS — K2100 Gastro-esophageal reflux disease with esophagitis, without bleeding: Secondary | ICD-10-CM

## 2018-11-07 DIAGNOSIS — K30 Functional dyspepsia: Secondary | ICD-10-CM

## 2018-11-07 MED ORDER — AMOXICILLIN-POT CLAVULANATE 875-125 MG PO TABS: 1.0000 | ORAL_TABLET | Freq: Two times a day (BID) | ORAL | 0 refills | Status: DC

## 2018-11-07 MED ORDER — OMEPRAZOLE 40 MG PO CPDR: 40.0000 mg | DELAYED_RELEASE_CAPSULE | Freq: Every day | ORAL | 2 refills | Status: DC

## 2018-11-07 NOTE — Progress Notes (Deleted)
Stomach/chest pressure with cough - felt SOB last night, started 4 days ago, mainly in stomach but feels like there is pressure on his diaphragm.  Sinus issues - drainage.

## 2018-11-07 NOTE — Progress Notes (Signed)
Patient ID: Jake Pearson, male   DOB: 09-16-1955, 63 y.o.   MRN: [id number] .Marland KitchenVirtual Visit via Telephone Note  I connected with Jake Pearson on 11/09/18 at  1:20 PM EDT by telephone and verified that I am speaking with the correct person using two identifiers.  Location: Patient: home Provider: home   I discussed the limitations, risks, security and privacy concerns of performing an evaluation and management service by telephone and the availability of in person appointments. I also discussed with the patient that there may be a patient responsible charge related to this service. The patient expressed understanding and agreed to proceed.   History of Present Illness: Pt is a 63 yo male who calls into the clinic with 5 days of sinus pressure and epigastric pressure that radiates into chest. He admits he did feel a little SOB last night. He tooks some tums and felt better. He denies any fever, chills, body aches, diarrhea, loss of taste or smell. He does not have any leg swelling. No hx of asthma or lung disease. Denies any wheezing. He feels very congested nasal. He works from home. No sick contacts. No direct exposure to COVID. His sinuses continue to drain and cause headache frontal/maxillary/behind eyes.      .. Active Ambulatory Problems    Diagnosis Date Noted  . Enlarged prostate 05/15/2013  . Hearing loss of left ear 11/17/2016  . Arthritis of right acromioclavicular joint 07/22/2017  . Right lower quadrant abdominal pain 07/22/2017  . Snoring 10/13/2017  . Nasal congestion 10/13/2017  . 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 Fatty liver 10/13/2017  . Lumbar degenerative disc disease 01/12/2018   Resolved Ambulatory Problems    Diagnosis Date Noted  . Subacute cough 11/17/2016  . Mid back pain 10/13/2017   Past Medical History:  Diagnosis Date  . History of nephrolithiasis    Reviewed med, allergy, problem list.     Observations/Objective: No acute distress.  Normal  breathing.  No coughing.   .. Today's Vitals   11/07/18 1133  BP: 138/89  Pulse: 70  Temp: 97.6 F (36.4 C)  TempSrc: Oral  Weight: 170 lb (77.1 kg)  Height: 5\' 8"  (1.727 m)   Body mass index is 25.85 kg/m.   Assessment and Plan: Marland KitchenMarland KitchenArcadio was seen today for sinus problem.  Diagnoses and all orders for this visit:  Acute non-recurrent pansinusitis -     amoxicillin-clavulanate (AUGMENTIN) 875-125 MG tablet; Take 1 tablet by mouth 2 (two) times daily.  Indigestion -     omeprazole (PRILOSEC) 40 MG capsule; Take 1 capsule (40 mg total) by mouth daily.  Gastroesophageal reflux disease with esophagitis -     omeprazole (PRILOSEC) 40 MG capsule; Take 1 capsule (40 mg total) by mouth daily.   I sent augmentin and flonase for sinus infection. Pt reported to have flonase at home. Omeprazole to start for indigestion symptoms. Stay on omeprazole for at least a month. If continues to have epigastric pressure and chest pressure needs office visit. At times even cardiac events can cause reflux like symptoms. Consider BRAT diet. Rest and hydrate.   At this time I do not think he needs COVID testing. Discussed if he were to have a fever, loss or taste of smell, SOB, cough to go to a drive by site. He would need to self isolate while test running.   Follow Up Instructions:    I discussed the assessment and treatment plan with the patient. The patient was provided an opportunity to  ask questions and all were answered. The patient agreed with the plan and demonstrated an understanding of the instructions.   The patient was advised to call back or seek an in-person evaluation if the symptoms worsen or if the condition fails to improve as anticipated.  I provided 15 minutes of non-face-to-face time during this encounter.   Tandy GawJade Markiya Keefe, PA-C

## 2018-11-24 ENCOUNTER — Ambulatory Visit (INDEPENDENT_AMBULATORY_CARE_PROVIDER_SITE_OTHER): Payer: BC Managed Care – PPO | Admitting: Osteopathic Medicine

## 2018-11-24 ENCOUNTER — Ambulatory Visit (HOSPITAL_BASED_OUTPATIENT_CLINIC_OR_DEPARTMENT_OTHER)
Admission: RE | Admit: 2018-11-24 | Discharge: 2018-11-24 | Disposition: A | Discharge status: Home / Self Care | Payer: BC Managed Care – PPO | Source: Ambulatory Visit | Attending: Osteopathic Medicine | Admitting: Osteopathic Medicine

## 2018-11-24 ENCOUNTER — Encounter: Payer: Self-pay | Admitting: Osteopathic Medicine

## 2018-11-24 ENCOUNTER — Other Ambulatory Visit: Payer: Self-pay

## 2018-11-24 VITALS — BP 123/78 | HR 75 | Temp 98.7°F | Wt 172.5 lb

## 2018-11-24 DIAGNOSIS — R1011 Right upper quadrant pain: Secondary | ICD-10-CM | POA: Insufficient documentation

## 2018-11-24 DIAGNOSIS — R1030 Lower abdominal pain, unspecified: Secondary | ICD-10-CM | POA: Diagnosis not present

## 2018-11-24 DIAGNOSIS — R350 Frequency of micturition: Secondary | ICD-10-CM

## 2018-11-24 LAB — POCT URINALYSIS DIPSTICK
Bilirubin, UA: NEGATIVE
Glucose, UA: NEGATIVE
Ketones, UA: NEGATIVE
Leukocytes, UA: NEGATIVE
Nitrite, UA: NEGATIVE
Protein, UA: NEGATIVE
Spec Grav, UA: 1.02 (ref 1.010–1.025)
Urobilinogen, UA: 1 E.U./dL
pH, UA: 7 (ref 5.0–8.0)

## 2018-11-24 MED ORDER — OXYCODONE-ACETAMINOPHEN 5-325 MG PO TABS: 1.0000 | ORAL_TABLET | Freq: Four times a day (QID) | ORAL | 0 refills | Status: DC | PRN

## 2018-11-24 NOTE — Progress Notes (Signed)
HPI: Jake Pearson is a 63 y.o. male who  has a past medical history of Enlarged prostate, History of nephrolithiasis, and Mid back pain (10/13/2017).  [facility] today, 11/24/18,  for chief complaint of:  Abdomen pain    . Context: Recently eval/treated 11/07/18 for sinusitis associated w/ epigastric pressure that improved w/ Tums - Rx Augmentin course and started Prilosec 40 mg  . Location: right side abdomen  . Quality: pressure in RUQ abdomen, bloating, R shoulder pain, R flank pain . Duration: started night before last . Timing: constant but worse w/ lying down . Assoc signs/symptoms: increased urinary frequency.  . UA (+)blood, hx renal stones     At today's visit 11/24/18 ... PMH, PSH, FH reviewed and updated as needed.  Current medication list and allergy/intolerance hx reviewed and updated as needed. (See remainder of HPI, ROS, Phys Exam below)   No results found.  Results for orders placed or performed in visit on 11/24/18 (from the past 72 hour(s))  POCT Urinalysis Dipstick     Status: None   Collection Time: 11/24/18 10:37 AM  Result Value Ref Range   Color, UA AMBER    Clarity, UA CLEAR    Glucose, UA Negative Negative   Bilirubin, UA NEGATIVE    Ketones, UA NEGATIVE    Spec Grav, UA 1.020 1.010 - 1.025   Blood, UA TRACE-INTACT    pH, UA 7.0 5.0 - 8.0   Protein, UA Negative Negative   Urobilinogen, UA 1.0 0.2 or 1.0 E.U./dL   Nitrite, UA NEGATIVE    Leukocytes, UA Negative Negative   Appearance     Odor            ASSESSMENT/PLAN: The primary encounter diagnosis was RUQ abdominal pain. A diagnosis of Increased urinary frequency was also pertinent to this visit.  Symptoms and exam seem more likely gallbladder related though differential diagnosis would include kidney stone really just given that the patient feels that this is similar to previous kidney stone episodes.  I think for now let us start  with ultrasound of the gallbladder and would consider CT scan of the abdomen if the gallbladder is totally normal.  We will also get blood work.  Patient was given precautions for cholecystitis or severe nephrolithiasis symptoms and go to emergency room if severe pain or other concerns.   Orders Placed This Encounter  Procedures  . Urine Culture  . US ABDOMEN LIMITED RUQ  . Urinalysis, microscopic only  . COMPLETE METABOLIC PANEL WITH GFR  . Lipase  . Gamma GT  . CBC with Differential/Platelet  . POCT Urinalysis Dipstick     Meds ordered this encounter  Medications  . oxyCODONE-acetaminophen (PERCOCET/ROXICET) 5-325 MG tablet    Sig: Take 1-2 tablets by mouth every 6 (six) hours as needed for severe pain.    Dispense:  10 tablet    Refill:  0    Patient Instructions  Plan:  Symptoms and exam are most consistent with inflamed gallbladder or gallstones.  Most of the time this is not an emergency but infection can occur, and in that case people will usually have fever, nausea, severe pain, or feel weak or lightheaded.  If those symptoms develop please seek emergency medical care as surgery might be required.   We are going to get some blood work here today in the lab in this building.  Unfortunately we are not able to get ultrasound here today, please see  attached instructions for directions to the med center in [facility] where we can get an ultrasound to look at the gallbladder ASAP.  I do not have a strong suspicion that kidney stones are the problem at this point, but I have printed a prescription for pain medications if needed, in case gallbladder is normal on ultrasound but you get worse over the weekend.         Follow-up plan: Return for RECHECK PENDING RESULTS / IF WORSE OR  CHANGE.                                                 ################################################# ################################################# ################################################# #################################################    Current Meds  Medication Sig  . omeprazole (PRILOSEC) 40 MG capsule Take 1 capsule (40 mg total) by mouth daily.    No Known Allergies     Review of Systems:  Constitutional: No recent illness  HEENT: No  headache, no vision change  Cardiac: No  chest pain, No  pressure, No palpitations  Respiratory:  No  shortness of breath. No  Cough  Gastrointestinal: +abdominal pain, no change on bowel habits  Musculoskeletal: No new myalgia/arthralgia  Skin: No  Rash  Neurologic: No  weakness, No  Dizziness  Exam:  BP 123/78 (BP Location: Left Arm, Patient Position: Sitting, Cuff Size: Normal)   Pulse 75   Temp 98.7 F (37.1 C) (Oral)   Wt 172 lb 8 oz (78.2 kg)   BMI 26.23 kg/m   Constitutional: VS see above. General Appearance: alert, well-developed, well-nourished, NAD  Eyes: Normal lids and conjunctive, non-icteric sclera  Neck: No masses, trachea midline.   Respiratory: Normal respiratory effort. no wheeze, no rhonchi, no rales  Cardiovascular: S1/S2 normal, no murmur, no rub/gallop auscultated. RRR.   Musculoskeletal: Gait normal. Symmetric and independent movement of all extremities, Lloyds negative bilaterally.   Abdominal: +TTP RUQ and LLQ, non-distended, no appreciable organomegaly, neg Murphy's, BS WNLx4  Neurological: Normal balance/coordination. No tremor.  Skin: warm, dry, intact.   Psychiatric: Normal judgment/insight. Normal mood and affect. Oriented x3.       Visit summary with medication list and pertinent instructions was printed for patient to review, patient was advised to alert [place] if any updates are needed. All questions at time of visit  were answered - patient instructed to contact office with any additional concerns. ER/RTC precautions were reviewed with the patient and understanding verbalized.     Please note: voice recognition software was used to produce this document, and typos may escape review. Please contact Dr. Sheppard Coil for any needed clarifications.    Follow up plan: Return for RECHECK PENDING RESULTS / IF WORSE OR CHANGE.

## 2018-11-24 NOTE — Patient Instructions (Addendum)
Plan:  Symptoms and exam are most consistent with inflamed gallbladder or gallstones.  Most of the time this is not an emergency but infection can occur, and in that case people will usually have fever, nausea, severe pain, or feel weak or lightheaded.  If those symptoms develop please seek emergency medical care as surgery might be required.   We are going to get some blood work here today in the lab in this building.  Unfortunately we are not able to get ultrasound here today, please see attached instructions for directions to the med center in [facility] where we can get an ultrasound to look at the gallbladder ASAP.  I do not have a strong suspicion that kidney stones are the problem at this point, but I have printed a prescription for pain medications if needed, in case gallbladder is normal on ultrasound but you get worse over the weekend.

## 2018-11-25 LAB — URINE CULTURE
MICRO NUMBER:: 750890
Result:: NO GROWTH
SPECIMEN QUALITY:: ADEQUATE

## 2018-11-25 LAB — URINALYSIS, MICROSCOPIC ONLY
Bacteria, UA: NONE SEEN /HPF
Hyaline Cast: NONE SEEN /LPF
RBC / HPF: NONE SEEN /HPF (ref 0–2)
Squamous Epithelial / LPF: NONE SEEN /HPF (ref <=5)
WBC, UA: NONE SEEN /HPF (ref 0–5)

## 2018-11-27 NOTE — Addendum Note (Signed)
Addended by: Maryla Morrow on: 11/27/2018 01:20 PM   Modules accepted: Orders

## 2018-11-29 ENCOUNTER — Other Ambulatory Visit: Payer: Self-pay

## 2018-11-29 ENCOUNTER — Ambulatory Visit (INDEPENDENT_AMBULATORY_CARE_PROVIDER_SITE_OTHER): Payer: BC Managed Care – PPO

## 2018-11-29 DIAGNOSIS — R1030 Lower abdominal pain, unspecified: Secondary | ICD-10-CM | POA: Diagnosis not present

## 2018-11-29 LAB — CBC WITH DIFFERENTIAL/PLATELET
Absolute Monocytes: 547 cells/uL (ref 200–950)
Basophils Absolute: 31 cells/uL (ref 0–200)
Basophils Relative: 0.4 %
Eosinophils Absolute: 162 cells/uL (ref 15–500)
Eosinophils Relative: 2.1 %
HCT: 42.5 % (ref 38.5–50.0)
Hemoglobin: 14.7 g/dL (ref 13.2–17.1)
Lymphs Abs: 1617 cells/uL (ref 850–3900)
MCH: 30.9 pg (ref 27.0–33.0)
MCHC: 34.6 g/dL (ref 32.0–36.0)
MCV: 89.5 fL (ref 80.0–100.0)
MPV: 10 fL (ref 7.5–12.5)
Monocytes Relative: 7.1 %
Neutro Abs: 5344 cells/uL (ref 1500–7800)
Neutrophils Relative %: 69.4 %
Platelets: 210 10*3/uL (ref 140–400)
RBC: 4.75 10*6/uL (ref 4.20–5.80)
RDW: 11.8 % (ref 11.0–15.0)
Total Lymphocyte: 21 %
WBC: 7.7 10*3/uL (ref 3.8–10.8)

## 2018-11-29 LAB — COMPLETE METABOLIC PANEL WITH GFR
AG Ratio: 1.5 (calc) (ref 1.0–2.5)
ALT: 26 U/L (ref 9–46)
AST: 17 U/L (ref 10–35)
Albumin: 4.2 g/dL (ref 3.6–5.1)
Alkaline phosphatase (APISO): 53 U/L (ref 35–144)
BUN: 15 mg/dL (ref 7–25)
CO2: 28 mmol/L (ref 20–32)
Calcium: 9.2 mg/dL (ref 8.6–10.3)
Chloride: 102 mmol/L (ref 98–110)
Creat: 1.12 mg/dL (ref 0.70–1.25)
GFR, Est African American: 82 mL/min/{1.73_m2} (ref >=60)
GFR, Est Non African American: 71 mL/min/{1.73_m2} (ref >=60)
Globulin: 2.8 g/dL (calc) (ref 1.9–3.7)
Glucose, Bld: 91 mg/dL (ref 65–99)
Potassium: 3.9 mmol/L (ref 3.5–5.3)
Sodium: 137 mmol/L (ref 135–146)
Total Bilirubin: 1 mg/dL (ref 0.2–1.2)
Total Protein: 7 g/dL (ref 6.1–8.1)

## 2018-11-29 LAB — GAMMA GT: GGT: 23 U/L (ref 3–70)

## 2018-11-29 LAB — LIPASE: Lipase: 39 U/L (ref 7–60)

## 2018-11-29 MED ORDER — IOHEXOL 300 MG/ML  SOLN
100.0000 mL | Freq: Once | INTRAMUSCULAR | Status: AC | PRN
  Administered 2018-11-29: 100 mL via INTRAVENOUS

## 2018-12-04 ENCOUNTER — Other Ambulatory Visit: Payer: Self-pay

## 2018-12-04 ENCOUNTER — Ambulatory Visit (INDEPENDENT_AMBULATORY_CARE_PROVIDER_SITE_OTHER): Payer: BC Managed Care – PPO | Admitting: Family Medicine

## 2018-12-04 ENCOUNTER — Encounter: Payer: Self-pay | Admitting: Family Medicine

## 2018-12-04 VITALS — BP 114/77 | HR 69 | Ht 68.0 in | Wt 175.0 lb

## 2018-12-04 DIAGNOSIS — Z13220 Encounter for screening for lipoid disorders: Secondary | ICD-10-CM

## 2018-12-04 DIAGNOSIS — N323 Diverticulum of bladder: Secondary | ICD-10-CM

## 2018-12-04 DIAGNOSIS — J90 Pleural effusion, not elsewhere classified: Secondary | ICD-10-CM | POA: Insufficient documentation

## 2018-12-04 DIAGNOSIS — I700 Atherosclerosis of aorta: Secondary | ICD-10-CM

## 2018-12-04 DIAGNOSIS — N400 Enlarged prostate without lower urinary tract symptoms: Secondary | ICD-10-CM | POA: Diagnosis not present

## 2018-12-04 DIAGNOSIS — K760 Fatty (change of) liver, not elsewhere classified: Secondary | ICD-10-CM | POA: Diagnosis not present

## 2018-12-04 DIAGNOSIS — N200 Calculus of kidney: Secondary | ICD-10-CM

## 2018-12-04 NOTE — Assessment & Plan Note (Signed)
Seen on CT. Explained benign lesion. Asymptomatic.

## 2018-12-04 NOTE — Progress Notes (Signed)
Established Patient Office Visit  Subjective:  Patient ID: Jake Pearson, male    DOB: March 19, 1956  Age: 63 y.o. MRN: [id number]  CC:  Chief Complaint  Patient presents with  . Results    HPI Jake NicelyWilliam Llorens presents for F/U CT results. He was seen on 8/7 by my partner for RUQ abdominal pain x 2 days with bloating and radiation to the shoulder. US show no acute finding but hepatic steatosis.  Follow up CT showed:  IMPRESSION: 1. New small right pleural effusion with patchy right lower lobe atelectasis or early infiltrate. Correlate clinically. 2. No acute abdominal findings or explanation for the patient's symptoms. 3. Bilateral nephrolithiasis without evidence of ureteral calculus or hydronephrosis. 4. Stable incidental findings including hepatic steatosis, renal cysts, mild colonic diverticulosis, a right-sided bladder diverticulum and mild Aortic Atherosclerosis (ICD10-I70.0).  He is actually feeling better.  He says the pain eventually started radiating more from the right upper quadrant down to the right lower quadrant and then over to the left lower abdomen.  He still just having some intermittent bloating but says that if he burps or passes gas he does get some relief.  He denies any cough, chest pain, shortness of breath.   Past Medical History:  Diagnosis Date  . Enlarged prostate   . History of nephrolithiasis   . Mid back pain 10/13/2017    Past Surgical History:  Procedure Laterality Date  . ARTHROSCOPIC REPAIR ACL    . KNEE ARTHROCENTESIS    . TONSILLECTOMY  Age 89   . VASECTOMY      Family History  Problem Relation Age of Onset  . Diabetes Maternal Uncle   . Diabetes Maternal Aunt   . Heart attack Maternal Grandfather   . Heart attack Maternal Uncle     Social History   Socioeconomic History  . Marital status: Married    Spouse name: Aram BeechamCynthia  . Number of children: 2  . Years of education: Not on file  . Highest education level: Not on file   Occupational History    Employer: [employer]  Social Needs  . Financial resource strain: Not on file  . Food insecurity    Worry: Not on file    Inability: Not on file  . Transportation needs    Medical: Not on file    Non-medical: Not on file  Tobacco Use  . Smoking status: Never Smoker  . Smokeless tobacco: Never Used  Substance and Sexual Activity  . Alcohol use: Yes    Alcohol/week: 1.0 standard drinks    Types: 1 drink(s) per week  . Drug use: No  . Sexual activity: Yes    Partners: Female  Lifestyle  . Physical activity    Days per week: Not on file    Minutes per session: Not on file  . Stress: Not on file  Relationships  . Social Musicianconnections    Talks on phone: Not on file    Gets together: Not on file    Attends religious service: Not on file    Active member of club or organization: Not on file    Attends meetings of clubs or organizations: Not on file    Relationship status: Not on file  . Intimate partner violence    Fear of current or ex partner: Not on file    Emotionally abused: Not on file    Physically abused: Not on file    Forced sexual activity: Not on file  Other Topics Concern  .  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  Has 2 kids.     Outpatient Medications Prior to Visit  Medication Sig Dispense Refill  . omeprazole (PRILOSEC) 40 MG capsule Take 1 capsule (40 mg total) by mouth daily. 30 capsule 2  . oxyCODONE-acetaminophen (PERCOCET/ROXICET) 5-325 MG tablet Take 1-2 tablets by mouth every 6 (six) hours as needed for severe pain. 10 tablet 0  . amoxicillin-clavulanate (AUGMENTIN) 875-125 MG tablet Take 1 tablet by mouth 2 (two) times daily. (Patient not taking: Reported on 11/24/2018) 20 tablet 0   No facility-administered medications prior to visit.     No Known Allergies  ROS Review of Systems    Objective:    Physical Exam  Constitutional: He is oriented to person, place, and time. He appears  well-developed and well-nourished.  HENT:  Head: Normocephalic and atraumatic.  Cardiovascular: Normal rate, regular rhythm and normal heart sounds.  Pulmonary/Chest: Effort normal and breath sounds normal.  Abdominal: Bowel sounds are normal. He exhibits no distension. There is no abdominal tenderness.  Neurological: He is alert and oriented to person, place, and time.  Skin: Skin is warm and dry.  Psychiatric: He has a normal mood and affect. His behavior is normal.    BP 114/77   Pulse 69   Ht 5\' 8"  (1.727 m)   Wt 175 lb (79.4 kg)   SpO2 98%   BMI 26.61 kg/m  Wt Readings from Last 3 Encounters:  12/04/18 175 lb (79.4 kg)  11/24/18 172 lb 8 oz (78.2 kg)  11/07/18 170 lb (77.1 kg)     Health Maintenance Due  Topic Date Due  . Hepatitis C Screening  [birth year]  . HIV Screening  11/26/1972  . INFLUENZA VACCINE  11/18/2018    There are no preventive care reminders to display for this patient.  No results found for: TSH Lab Results  Component Value Date   WBC 7.7 11/28/2018   HGB 14.7 11/28/2018   HCT 42.5 11/28/2018   MCV 89.5 11/28/2018   PLT 210 11/28/2018   Lab Results  Component Value Date   NA 137 11/28/2018   K 3.9 11/28/2018   CO2 28 11/28/2018   GLUCOSE 91 11/28/2018   BUN 15 11/28/2018   CREATININE 1.12 11/28/2018   BILITOT 1.0 11/28/2018   ALKPHOS 52 10/25/2012   AST 17 11/28/2018   ALT 26 11/28/2018   PROT 7.0 11/28/2018   ALBUMIN 4.2 10/25/2012   CALCIUM 9.2 11/28/2018   Lab Results  Component Value Date   CHOL 166 10/25/2012   Lab Results  Component Value Date   HDL 40 10/25/2012   Lab Results  Component Value Date   LDLCALC 106 (H) 10/25/2012   Lab Results  Component Value Date   TRIG 98 10/25/2012   Lab Results  Component Value Date   CHOLHDL 4.2 10/25/2012   No results found for: HGBA1C    Assessment & Plan:   Problem List Items Addressed This Visit      Cardiovascular and Mediastinum   Aortic atherosclerosis (HCC)      Respiratory   Pleural effusion, right    Unclear why he has a pleural effusion.  We discussed working on increasing activity and exercise levels.  I do suspect that there is also some atelectasis there.  No indication that he has pneumonia.  I would like to do a chest x-ray in about a month to make sure that it  has improved.      Relevant Orders   DG Chest 2 View     Digestive   Fatty liver - Primary    Work on healthy diet and regular exercise. Recheck in 3 yr. Liver enzymes are normal. Recheck in 1 year.        Relevant Orders   Lipid Panel w/reflex Direct LDL     Genitourinary   Kidney stones    They are in the kidneys. Stable.       Bladder diverticulum    Seen on CT. Explained benign lesion. Asymptomatic.         Other   Enlarged prostate    Stable. No worrisome findings.        Other Visit Diagnoses    Screening, lipid       Relevant Orders   Lipid Panel w/reflex Direct LDL      No orders of the defined types were placed in this encounter.   Follow-up: Return if symptoms worsen or fail to improve.    Beatrice Lecher, MD

## 2018-12-04 NOTE — Assessment & Plan Note (Signed)
They are in the kidneys. Stable.

## 2018-12-04 NOTE — Assessment & Plan Note (Signed)
Unclear why he has a pleural effusion.  We discussed working on increasing activity and exercise levels.  I do suspect that there is also some atelectasis there.  No indication that he has pneumonia.  I would like to do a chest x-ray in about a month to make sure that it has improved.

## 2018-12-04 NOTE — Assessment & Plan Note (Signed)
Work on [employer] and regular exercise. Recheck in 3 yr. Liver enzymes are normal. Recheck in 1 year.

## 2018-12-04 NOTE — Assessment & Plan Note (Signed)
Stable. No worrisome findings.

## 2018-12-08 LAB — LIPID PANEL W/REFLEX DIRECT LDL
Cholesterol: 167 mg/dL (ref <200)
HDL: 39 mg/dL — ABNORMAL LOW (ref >=40)
LDL Cholesterol (Calc): 107 mg/dL (calc) — ABNORMAL HIGH
Non-HDL Cholesterol (Calc): 128 mg/dL (calc) (ref <130)
Total CHOL/HDL Ratio: 4.3 (calc) (ref <5.0)
Triglycerides: 114 mg/dL (ref <150)

## 2018-12-27 ENCOUNTER — Other Ambulatory Visit: Payer: Self-pay

## 2018-12-27 ENCOUNTER — Ambulatory Visit (INDEPENDENT_AMBULATORY_CARE_PROVIDER_SITE_OTHER): Payer: BC Managed Care – PPO | Admitting: Physician Assistant

## 2018-12-27 ENCOUNTER — Encounter: Payer: Self-pay | Admitting: Physician Assistant

## 2018-12-27 VITALS — Ht 68.0 in | Wt 174.0 lb

## 2018-12-27 DIAGNOSIS — H7202 Central perforation of tympanic membrane, left ear: Secondary | ICD-10-CM | POA: Diagnosis not present

## 2018-12-27 DIAGNOSIS — H8111 Benign paroxysmal vertigo, right ear: Secondary | ICD-10-CM

## 2018-12-27 MED ORDER — MECLIZINE HCL 25 MG PO TABS: 25.0000 mg | ORAL_TABLET | Freq: Three times a day (TID) | ORAL | 0 refills | Status: DC | PRN

## 2018-12-27 NOTE — Progress Notes (Signed)
Subjective:    Patient ID: Jake Pearson, male    DOB: [birth year], 63 y.o.   MRN: [id number]  HPI  Pt is a 63 yo male who presents to the clinic with dizziness that started suddenly this morning about 3:30am. He noticed when he when to turn his head the room felt like it was "spinning". He is a little nauseated but no vomiting. Denies any sinus pressure, ST, fever, chills, body aches. He does have some sharp ear pains in the left ear. He has had tubes put in that ear as well. He also notes hearing loss.   .. Active Ambulatory Problems    Diagnosis Date Noted  . Enlarged prostate 05/15/2013  . Hearing loss of left ear 11/17/2016  . Arthritis of right acromioclavicular joint 07/22/2017  . Snoring 10/13/2017  . 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 Fatty liver 10/13/2017  . Lumbar degenerative disc disease 01/12/2018  . Aortic atherosclerosis ([place]) 12/04/2018  . Kidney stones 12/04/2018  . Pleural effusion, right 12/04/2018  . 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 BPPV (benign paroxysmal positional vertigo), right 12/28/2018   Resolved Ambulatory Problems    Diagnosis Date Noted  . Subacute cough 11/17/2016  . Right lower quadrant abdominal pain 07/22/2017  . Mid back pain 10/13/2017  . Nasal congestion 10/13/2017   Past Medical History:  Diagnosis Date  . History of nephrolithiasis       Review of Systems  All other systems reviewed and are negative.      Objective:   Physical Exam Vitals signs reviewed.  Constitutional:      Appearance: Normal appearance.  HENT:     Right Ear: Tympanic membrane, ear canal and external ear normal. There is no impacted cerumen.     Left Ear: Ear canal and external ear normal. There is no impacted cerumen.     Ears:     Comments: Large hole in TM from 3-7 oclock.     Nose: Nose normal.     Mouth/Throat:     Mouth: Mucous membranes are moist.     Pharynx: Oropharynx is clear. No oropharyngeal exudate.   Eyes:     Extraocular Movements: Extraocular movements intact.     Conjunctiva/sclera: Conjunctivae normal.     Pupils: Pupils are equal, round, and reactive to light.  Cardiovascular:     Rate and Rhythm: Normal rate and regular rhythm.  Pulmonary:     Effort: Pulmonary effort is normal.  Neurological:     General: No focal deficit present.     Mental Status: He is alert and oriented to person, place, and time.     Comments: Positive Dix-Hallpike to the right with nystagmus.   Psychiatric:        Mood and Affect: Mood normal.        Behavior: Behavior normal.           Assessment & Plan:  Marland KitchenMarland KitchenRickard was seen today for dizziness.  Diagnoses and all orders for this visit:  BPPV (benign paroxysmal positional vertigo), right -     meclizine (ANTIVERT) 25 MG tablet; Take 1 tablet (25 mg total) by mouth 3 (three) times daily as needed for dizziness.  Central perforation of tympanic membrane of left ear  negative orthostatic BP.   Pt had a good response to epley maneuvers done in exam room today. His symptoms improved from arrival. Gave copy of epley maneuvers to do at least 3 rounds 3 times a day. antivert  to use as needed or before exercise. Written out of work for next 2 days. If no improvement tomorrow consider vestibular rehab.   Noted TM perforation. Per patient he does have hearing loss. Encouraged patient to follow with ENT. Hx of TM tubes in the past.

## 2018-12-28 ENCOUNTER — Encounter: Payer: Self-pay | Admitting: Physician Assistant

## 2018-12-28 DIAGNOSIS — H7202 Central perforation of tympanic membrane, left ear: Secondary | ICD-10-CM | POA: Insufficient documentation

## 2018-12-28 DIAGNOSIS — H8111 Benign paroxysmal vertigo, right ear: Secondary | ICD-10-CM | POA: Insufficient documentation

## 2019-03-30 ENCOUNTER — Emergency Department (INDEPENDENT_AMBULATORY_CARE_PROVIDER_SITE_OTHER)
Admission: EM | Admit: 2019-03-30 | Discharge: 2019-03-30 | Disposition: A | Discharge status: Home / Self Care | Payer: BC Managed Care – PPO | Source: Home / Self Care | Attending: Emergency Medicine | Admitting: Emergency Medicine

## 2019-03-30 ENCOUNTER — Other Ambulatory Visit: Payer: Self-pay

## 2019-03-30 ENCOUNTER — Encounter: Payer: Self-pay | Admitting: Emergency Medicine

## 2019-03-30 ENCOUNTER — Telehealth: Payer: Self-pay

## 2019-03-30 DIAGNOSIS — Z9189 Other specified personal risk factors, not elsewhere classified: Secondary | ICD-10-CM

## 2019-03-30 DIAGNOSIS — K219 Gastro-esophageal reflux disease without esophagitis: Secondary | ICD-10-CM | POA: Diagnosis not present

## 2019-03-30 MED ORDER — FLUTICASONE PROPIONATE 50 MCG/ACT NA SUSP: 2.0000 | Freq: Every day | NASAL | 0 refills | Status: DC

## 2019-03-30 NOTE — Discharge Instructions (Addendum)
Try taking the Prilosec on a regular basis for the next several days.  Flonase, saline nasal irrigation with a NeilMed sinus rinse and distilled water as often as you want for the nasal congestion and postnasal drip.  Sign up for my chart so that you can get your Covid test result as soon as possible.

## 2019-03-30 NOTE — Telephone Encounter (Signed)
Jake Pearson called and left a message stating he has shortness of breath. I tried to call patient. No answer. Left message for a return call.

## 2019-03-30 NOTE — ED Triage Notes (Signed)
Patient had acid reflux episode last night leaving him with sore throat; called in sick today and employer wants covid test done; no known exposure to covid positive person. He has not had his influenza vacc this season.

## 2019-03-30 NOTE — Telephone Encounter (Signed)
Jake Pearson called back and he is having shortness of breath, cough, chills and headache. Advised to go to the Urgent Care.

## 2019-03-30 NOTE — Telephone Encounter (Signed)
Agree recommend referral and evaluation to urgent care.

## 2019-03-30 NOTE — ED Provider Notes (Signed)
HPI  SUBJECTIVE:  Jake Pearson is a 63 y.o. male who presents for Covid testing.  Patient states that he was lying down last night, had an episode of reflux.  States that some of the reflux "got into my bronchial tree" which caused a great deal of coughing, wheezing, burning chest pain, shortness of breath and sore throat.  He states that the shortness of breath and chest pain have resolved.  States that he has had identical chest pain before with episodes of acid reflux.  There was no diaphoresis or exertional component with it.  He reports dry heaving secondary to postnasal drip, but no nausea.  He is tolerating p.o.  He reports headaches.  No body aches, fevers.  No change in baseline nasal congestion, postnasal drip.  No loss of sense of smell or taste, dental pain, diarrhea.  No known Covid or flu exposure.  He has not yet gotten his flu shot.  He took Excedrin within 4 to 6 hours of evaluation.  He tried Excedrin with improvement in his headache.  No aggravating factors.  He called to work today and his work is requiring Covid testing prior to him returning to work.  He is a Runner, broadcasting/film/video.  He has a past medical history of aortic atherosclerosis, GERD.  He takes Prilosec intermittently.  History of right pleural effusion.  No history of asthma, emphysema, COPD, smoking, diabetes, chronic kidney disease, HIV, immunocompromised, cancer.  JXB:[id number], Barbarann Ehlers, MD     Past Medical History:  Diagnosis Date  . Enlarged prostate   . History of nephrolithiasis   . Mid back pain 10/13/2017    Past Surgical History:  Procedure Laterality Date  . ARTHROSCOPIC REPAIR ACL    . KNEE ARTHROCENTESIS    . TONSILLECTOMY  Age 66   . VASECTOMY      Family History  Problem Relation Age of Onset  . Diabetes Maternal Uncle   . Diabetes Maternal Aunt   . Heart attack Maternal Grandfather   . Heart attack Maternal Uncle     Social History   Tobacco Use  . Smoking status: Never Smoker  . Smokeless  tobacco: Never Used  Substance Use Topics  . Alcohol use: Yes    Alcohol/week: 1.0 standard drinks    Types: 1 drink(s) per week  . Drug use: No    No current facility-administered medications for this encounter.  Current Outpatient Medications:  .  meclizine (ANTIVERT) 25 MG tablet, Take 1 tablet (25 mg total) by mouth 3 (three) times daily as needed for dizziness., Disp: 30 tablet, Rfl: 0 .  omeprazole (PRILOSEC) 40 MG capsule, Take 1 capsule (40 mg total) by mouth daily., Disp: 30 capsule, Rfl: 2  No Known Allergies   ROS  As noted in HPI.   Physical Exam  BP 138/75 (BP Location: Right Arm)   Pulse 77   Temp 98.4 F (36.9 C) (Oral)   Resp 16   Ht 5\' 8"  (1.727 m)   Wt 77.1 kg   SpO2 96%   BMI 25.85 kg/m   Constitutional: Well developed, well nourished, no acute distress Eyes: PERRL, EOMI, conjunctiva normal bilaterally HENT: Normocephalic, atraumatic,mucus membranes moist.  Positive clear nasal congestion normal turbinates.  No sinus tenderness.  Tonsils surgically absent.  Normal oropharynx.  No obvious postnasal drip Neck: No cervical lymphadenopathy, meningismus Respiratory: Clear to auscultation bilaterally, no rales, no wheezing, no rhonchi Cardiovascular: Normal rate and rhythm, no murmurs, no gallops, no rubs GI: Nondistended skin: No  rash, skin intact Musculoskeletal: No edema, no tenderness, no deformities Neurologic: Alert & oriented x 3, CN III-XII grossly intact, no motor deficits, sensation grossly intact Psychiatric: Speech and behavior appropriate   ED Course   Medications - No data to display  Orders Placed This Encounter  Procedures  . COVID-19 Nasopharyngeal ([facility])    Standing Status:   Standing    Number of Occurrences:   1   No results found for this or any previous visit (from the past 24 hour(s)). No results found.  ED Clinical Impression  1. At increased risk of exposure to COVID-19 virus      ED Assessment/Plan  Lungs are  clear, vitals normal, afebrile although he did take an antipyretic with impact past 4 to 6 hours.  He denies having fevers at home.  No evidence of an aspiration pneumonia at this point in time.  Suspect acid reflux causing symptoms.  Patient states that this is happened before, however he is requiring Covid testing prior to returning to work.  Will send off Covid PCR.  Isolation, masking until Covid test is returned.  Patient states that he has signed up for my chart.  Will advise regular Prilosec, states he does not need a prescription, saline nasal irrigation, Flonase for the nasal congestion.  No evidence of sinusitis today.  Declined prescription of ibuprofen.  May continue Excedrin as needed for headaches.  Follow-up with PMD as needed.  No orders of the defined types were placed in this encounter.   *This clinic note was created using Dragon dictation software. Therefore, there may be occasional mistakes despite careful proofreading.  ?    Melynda Ripple, MD 03/30/19 1724

## 2019-04-01 LAB — SARS-COV-2 RNA,(COVID-19) QUALITATIVE NAAT: SARS CoV2 RNA: NOT DETECTED

## 2019-04-20 IMAGING — DX DG SHOULDER 2+V*R*
3 series · 3 of 3 positions shown · non-contrast
Comparison: None.

CLINICAL DATA: Chronic right shoulder pain without known injury

EXAM:
RIGHT SHOULDER - 2+ VIEW

[shoulder grashey]
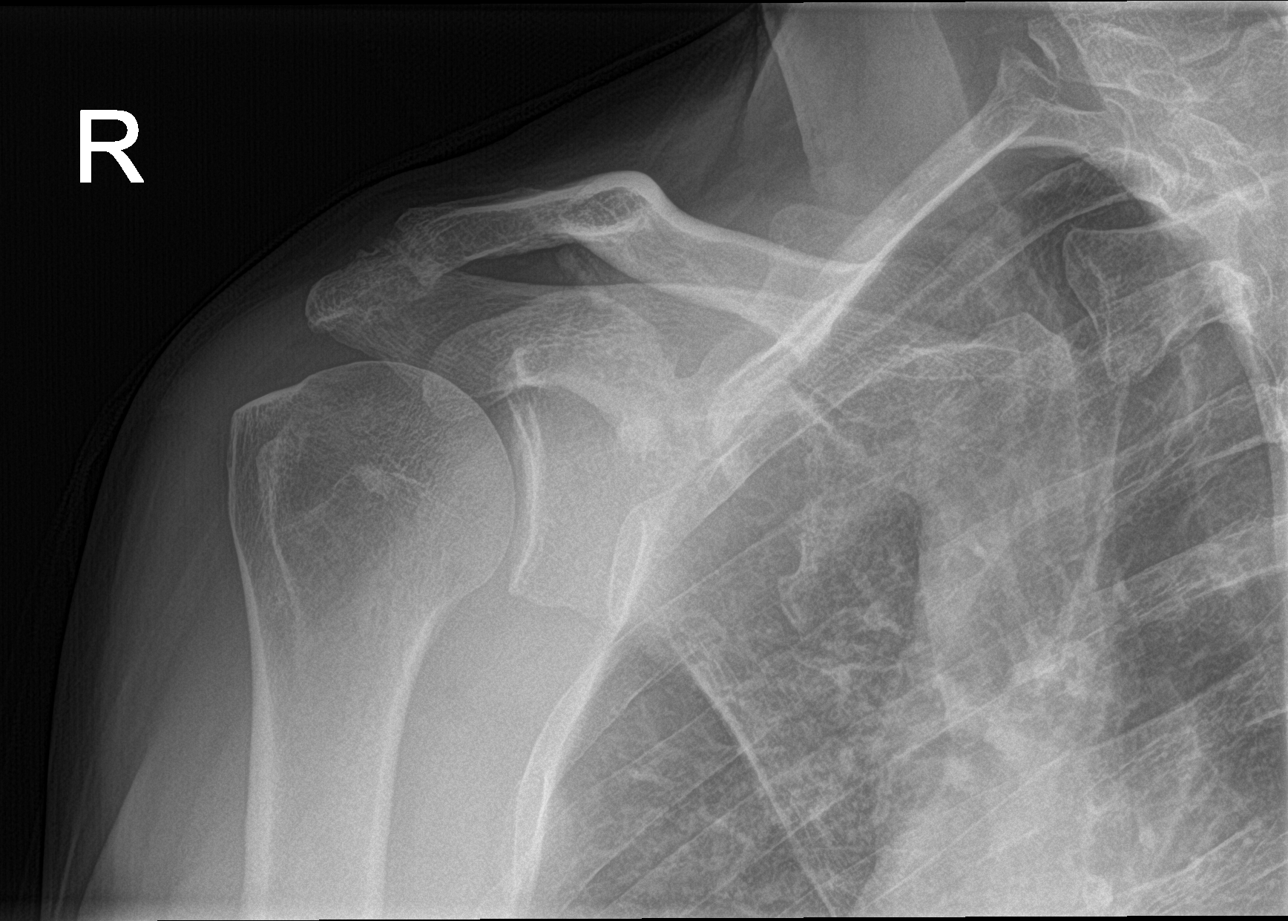

[shoulder y view]
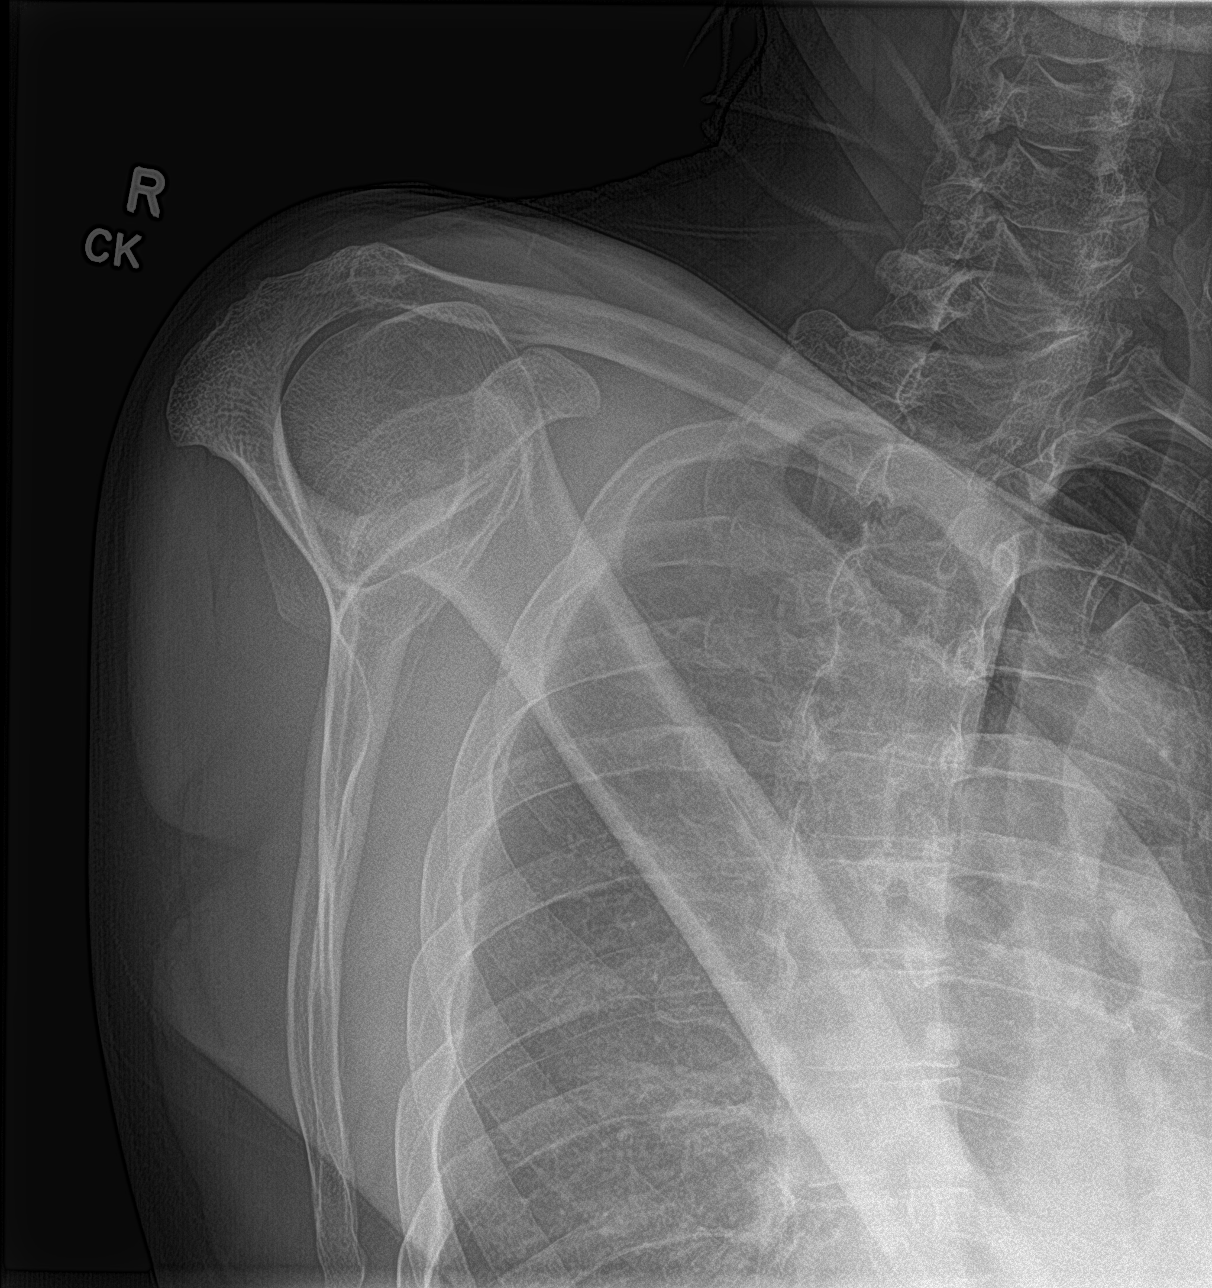

[shoulder axillary]
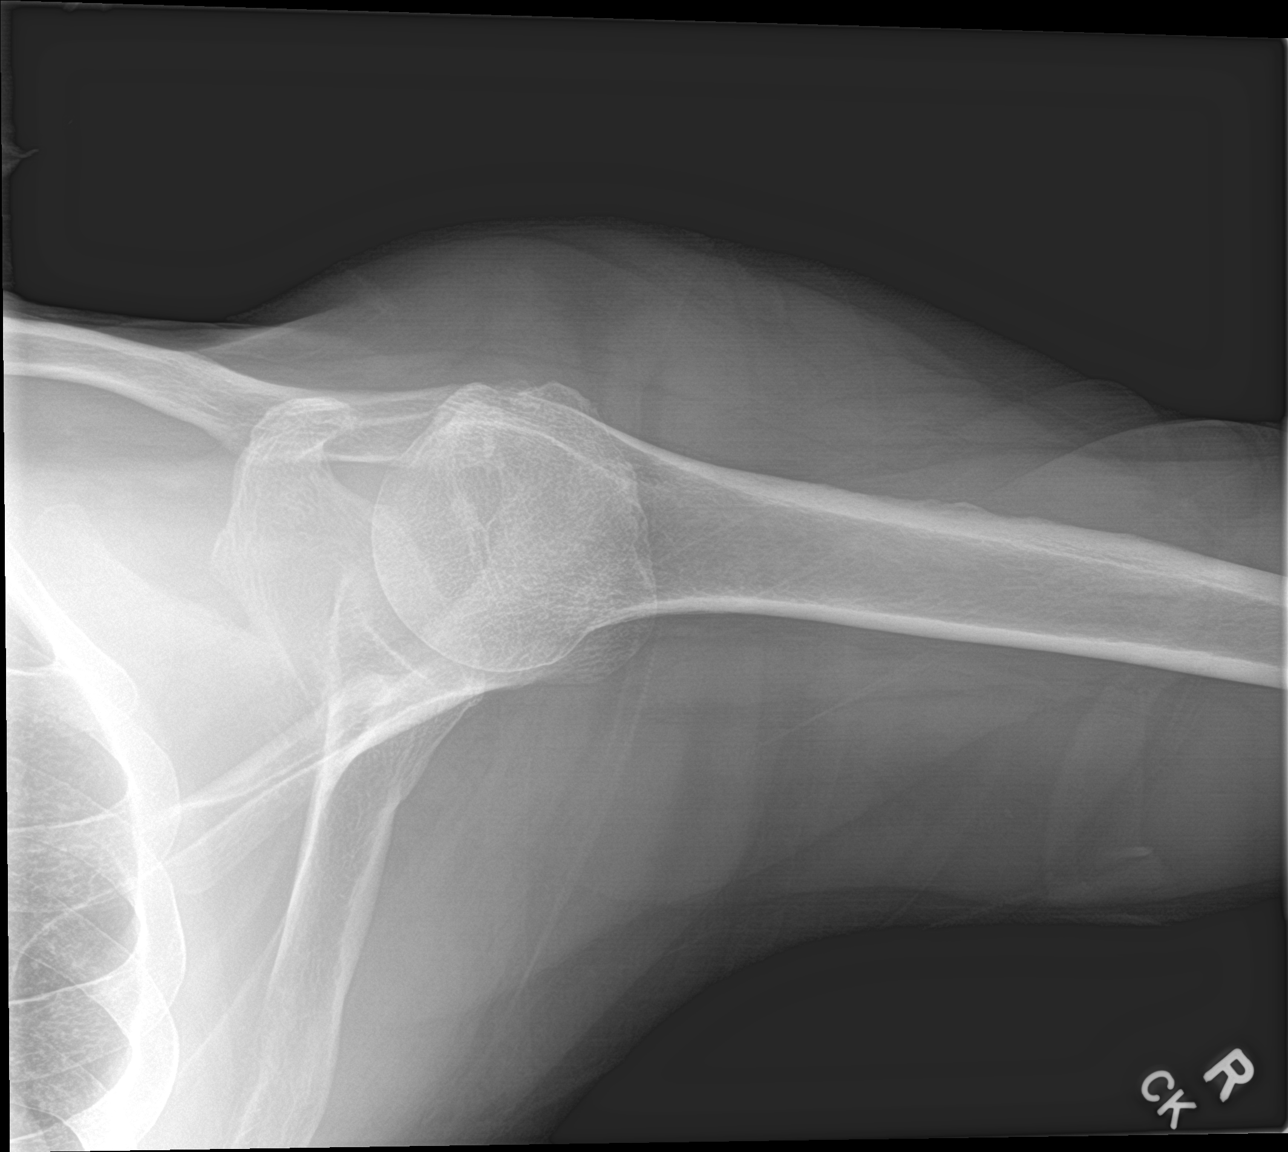

[3 of 3 positions shown; findings below may reference images not displayed]

FINDINGS: There is no evidence of fracture or dislocation. Mild AC joint
osteoarthritic with joint space narrowing and minimal undersurface
spurring. The glenohumeral joint appears intact. Minimal
calcification of the coracoclavicular ligament. The adjacent ribs
and lung are nonacute.
IMPRESSION: Mild AC joint osteoarthritis.  No acute osseous abnormality.

## 2019-05-25 ENCOUNTER — Telehealth (INDEPENDENT_AMBULATORY_CARE_PROVIDER_SITE_OTHER): Payer: BC Managed Care – PPO | Admitting: Family Medicine

## 2019-05-25 ENCOUNTER — Encounter: Payer: Self-pay | Admitting: Family Medicine

## 2019-05-25 DIAGNOSIS — R0602 Shortness of breath: Secondary | ICD-10-CM

## 2019-05-25 DIAGNOSIS — R350 Frequency of micturition: Secondary | ICD-10-CM | POA: Diagnosis not present

## 2019-05-25 NOTE — Progress Notes (Signed)
Virtual Visit via Video Note  I connected with Jake Pearson on 05/25/19 at  4:20 PM EST by a video enabled telemedicine application and verified that I am speaking with the correct person using two identifiers.   I discussed the limitations of evaluation and management by telemedicine and the availability of in person appointments. The patient expressed understanding and agreed to proceed.  Subjective:    CC: SOB  HPI: Jake Pearson C/O SOB intermittently for 2 year. But now feels it almost daily.  At the end of Jan went hiking felt really SOB on a trail that Jake Pearson usually takes.  Jake Pearson had some similar sxs back in December. No cough or wheezing. Post nasal drip x 2 years.  Jake Pearson says Jake Pearson used to wake up with a lot of phlegm in the back of his throat and then would most feel nauseated but says it actually has not been much of a problem lately.  Rarely gets chest pain.  Jake Pearson takes prilosec generic daily and feels it controls his reflux.  Has been on PPI x 2 weeks.    Note, Jake Pearson did have a CT back in August which showed a small pleural effusion.  Jake Pearson has never been a smoker.  Jake Pearson has been urinating more than normal.  No dysuria or hematuria.  Last week was going every 45 min.  No diet change or inc in caffeine intake. No blood in the urine. Usually gets up at night once but a week ago was getting up about 4 weeks.    Past medical history, Surgical history, Family history not pertinant except as noted below, Social history, Allergies, and medications have been entered into the medical record, reviewed, and corrections made.   Review of Systems: No fevers, chills, night sweats, weight loss, chest pain, or shortness of breath.   Objective:    General: Speaking clearly in complete sentences without any shortness of breath.  Alert and oriented x3.  Normal judgment. No apparent acute distress.   Impression and Recommendations:    SOB - could be pulmonary so will check CXR and spirometry. Evaluate for cardiac causes,  possibility stress test.  Consider GERD as well.  Has been on PPI x 2 weeks and no major improvement in symptoms so GERD is probably a little less likely but still a consideration.  We discussed discussed starting with the pulmonary work-up and then if negative then we will move forward with cardiac work-up.  This does not sound acute like an acute infection and sounds like is just been getting progressively worse over the last 2 years.  We will also check a CBC for anemia as well as for thyroid disorder.   Urinary frequency -suspect etiology from the prostate as Jake Pearson does have a history of BPH.  Would recommend that we work-up further with urinalysis.  We will have him complete an AUA questionnaire when Jake Pearson comes and and probably need to do a digital rectal exam again.   I discussed the assessment and treatment plan with the patient. The patient was provided an opportunity to ask questions and all were answered. The patient agreed with the plan and demonstrated an understanding of the instructions.   The patient was advised to call back or seek an in-person evaluation if the symptoms worsen or if the condition fails to improve as anticipated.   Nani Gasser, MD

## 2019-05-30 ENCOUNTER — Other Ambulatory Visit: Payer: Self-pay

## 2019-05-30 ENCOUNTER — Ambulatory Visit (INDEPENDENT_AMBULATORY_CARE_PROVIDER_SITE_OTHER): Payer: BC Managed Care – PPO

## 2019-05-30 ENCOUNTER — Encounter: Payer: Self-pay | Admitting: Family Medicine

## 2019-05-30 ENCOUNTER — Ambulatory Visit (INDEPENDENT_AMBULATORY_CARE_PROVIDER_SITE_OTHER): Payer: BC Managed Care – PPO | Admitting: Family Medicine

## 2019-05-30 VITALS — BP 121/72 | HR 67 | Ht 68.0 in | Wt 177.0 lb

## 2019-05-30 DIAGNOSIS — R809 Proteinuria, unspecified: Secondary | ICD-10-CM

## 2019-05-30 DIAGNOSIS — R319 Hematuria, unspecified: Secondary | ICD-10-CM

## 2019-05-30 DIAGNOSIS — N401 Enlarged prostate with lower urinary tract symptoms: Secondary | ICD-10-CM | POA: Diagnosis not present

## 2019-05-30 DIAGNOSIS — R0602 Shortness of breath: Secondary | ICD-10-CM

## 2019-05-30 DIAGNOSIS — R369 Urethral discharge, unspecified: Secondary | ICD-10-CM | POA: Diagnosis not present

## 2019-05-30 DIAGNOSIS — R351 Nocturia: Secondary | ICD-10-CM

## 2019-05-30 DIAGNOSIS — R350 Frequency of micturition: Secondary | ICD-10-CM | POA: Diagnosis not present

## 2019-05-30 LAB — POCT URINALYSIS DIP (CLINITEK)
Bilirubin, UA: NEGATIVE
Glucose, UA: NEGATIVE mg/dL
Ketones, POC UA: NEGATIVE mg/dL
Nitrite, UA: NEGATIVE
POC PROTEIN,UA: >=300 — AB
Spec Grav, UA: 1.025 (ref 1.010–1.025)
Urobilinogen, UA: 0.2 E.U./dL
pH, UA: 7.5 (ref 5.0–8.0)

## 2019-05-30 MED ORDER — FINASTERIDE 5 MG PO TABS: 5.0000 mg | ORAL_TABLET | Freq: Every day | ORAL | 3 refills | Status: DC

## 2019-05-30 MED ORDER — TAMSULOSIN HCL 0.4 MG PO CAPS: 0.4000 mg | ORAL_CAPSULE | Freq: Every day | ORAL | 3 refills | Status: DC

## 2019-05-30 NOTE — Progress Notes (Signed)
Acute Office Visit  Subjective:    Patient ID: Browning Southwood, male    DOB: [birth year], 64 y.o.   MRN: [id number]  Chief Complaint  Patient presents with  . Urinary Frequency    HPI Patient is in today for WALK - IN appt. 64 year old male was seen via virtual visit approximately a week ago at that time he complained about some urinary frequency particularly an increase in nocturia.  He did not complain about any dysuria or blood in the urine at the time.  He had a known history of BPH.  We discussed working up further when he came back and with an AUA and possible DRE as well as a UA.  He says he came in in particular today because about 2 days ago he noticed a little bit of a yellow and greenish discharge in his underwear and last night when he went to the bathroom he noticed that his urine was a little cloudy.  He said when he went again it looked more clear like maybe it was clearing up but then this morning when he came to get his x-ray and then went to the restroom he noticed a little bit of a dark to medium brown discharge which he says really almost look like little flakes in the urine.  He does have a prior history of kidney stones.  Last time he passed one was probably about 2 to 3 years ago.  And he did note that a couple of weeks ago he actually had some left flank pain that lasted maybe 2 hours and then seem to resolve on its own.  He thought maybe he was starting to have another kidney stone but because it went away and never returned he thought maybe it was just a gas pain.  Still having significant nocturia getting up about 4 times at night.  He denies any pain or pressure in the pelvic area or groin.  There he does report some increased sensitivity.  Last PSA was in 2019.       Past Medical History:  Diagnosis Date  . Enlarged prostate   . History of nephrolithiasis   . Mid back pain 10/13/2017    Past Surgical History:  Procedure Laterality Date  . ARTHROSCOPIC REPAIR  ACL    . KNEE ARTHROCENTESIS    . TONSILLECTOMY  Age 82   . VASECTOMY      Family History  Problem Relation Age of Onset  . Diabetes Maternal Uncle   . Diabetes Maternal Aunt   . Heart attack Maternal Grandfather   . Heart attack Maternal Uncle     Social History   Socioeconomic History  . Marital status: Married    Spouse name: Caren Griffins  . Number of children: 2  . Years of education: Not on file  . Highest education level: Not on file  Occupational History    Employer: [employer]  Tobacco Use  . Smoking status: Never Smoker  . Smokeless tobacco: Never Used  Substance and Sexual Activity  . Alcohol use: Yes    Alcohol/week: 1.0 standard drinks    Types: 1 drink(s) per week  . Drug use: No  . Sexual activity: Yes    Partners: Female  Other Topics Concern  . 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    Social Determinants of Health   Financial Resource Strain:   . Difficulty of Paying  Living Expenses: Not on file  Food Insecurity:   . Worried About Programme researcher, broadcasting/film/video in the Last Year: Not on file  . Ran Out of Food in the Last Year: Not on file  Transportation Needs:   . Lack of Transportation (Medical): Not on file  . Lack of Transportation (Non-Medical): Not on file  Physical Activity:   . Days of Exercise per Week: Not on file  . Minutes of Exercise per Session: Not on file  Stress:   . Feeling of Stress : Not on file  Social Connections:   . Frequency of Communication with Friends and Family: Not on file  . Frequency of Social Gatherings with Friends and Family: Not on file  . Attends Religious Services: Not on file  . Active Member of Clubs or Organizations: Not on file  . Attends Banker Meetings: Not on file  . Marital Status: Not on file  Intimate Partner Violence:   . Fear of Current or Ex-Partner: Not on file  . Emotionally Abused: Not on file  . Physically Abused: Not on file  .  Sexually Abused: Not on file    Outpatient Medications Prior to Visit  Medication Sig Dispense Refill  . fluticasone (FLONASE) 50 MCG/ACT nasal spray Place 2 sprays into both nostrils daily. 16 g 0  . omeprazole (PRILOSEC) 40 MG capsule Take 1 capsule (40 mg total) by mouth daily. 30 capsule 2   No facility-administered medications prior to visit.    No Known Allergies  Review of Systems     Objective:    Physical Exam Vitals reviewed.  Constitutional:      Appearance: He is well-developed.  HENT:     Head: Normocephalic and atraumatic.  Eyes:     Conjunctiva/sclera: Conjunctivae normal.  Cardiovascular:     Rate and Rhythm: Normal rate.  Pulmonary:     Effort: Pulmonary effort is normal.  Genitourinary:    Rectum: Normal.     Comments: DRE reveals normal sphincter tone.  3+ enlarged prostate but symmetric.  No palpable nodules and no bogginess.  No significant asymmetry. Skin:    General: Skin is dry.     Coloration: Skin is not pale.  Neurological:     Mental Status: He is alert and oriented to person, place, and time.  Psychiatric:        Behavior: Behavior normal.     BP 121/72   Pulse 67   Ht 5\' 8"  (1.727 m)   Wt 177 lb (80.3 kg)   SpO2 99%   BMI 26.91 kg/m  Wt Readings from Last 3 Encounters:  05/30/19 177 lb (80.3 kg)  03/30/19 170 lb (77.1 kg)  12/27/18 174 lb (78.9 kg)    There are no preventive care reminders to display for this patient.  There are no preventive care reminders to display for this patient.   No results found for: TSH Lab Results  Component Value Date   WBC 7.7 11/28/2018   HGB 14.7 11/28/2018   HCT 42.5 11/28/2018   MCV 89.5 11/28/2018   PLT 210 11/28/2018   Lab Results  Component Value Date   NA 137 11/28/2018   K 3.9 11/28/2018   CO2 28 11/28/2018   GLUCOSE 91 11/28/2018   BUN 15 11/28/2018   CREATININE 1.12 11/28/2018   BILITOT 1.0 11/28/2018   ALKPHOS 52 10/25/2012   AST 17 11/28/2018   ALT 26 11/28/2018    PROT 7.0 11/28/2018   ALBUMIN 4.2  10/25/2012   CALCIUM 9.2 11/28/2018   Lab Results  Component Value Date   CHOL 167 12/07/2018   Lab Results  Component Value Date   HDL 39 (L) 12/07/2018   Lab Results  Component Value Date   LDLCALC 107 (H) 12/07/2018   Lab Results  Component Value Date   TRIG 114 12/07/2018   Lab Results  Component Value Date   CHOLHDL 4.3 12/07/2018   No results found for: HGBA1C     Assessment & Plan:   Problem List Items Addressed This Visit      Other   BPH associated with nocturia   Relevant Medications   tamsulosin (FLOMAX) 0.4 MG CAPS capsule   finasteride (PROSCAR) 5 MG tablet   Other Relevant Orders   PSA   CBC with Differential/Platelet    Other Visit Diagnoses    Frequency of urination    -  Primary   Relevant Orders   POCT URINALYSIS DIP (CLINITEK) (Completed)   PSA   CBC with Differential/Platelet   Urinalysis w microscopic + reflex cultur   Urethral discharge in male       Relevant Orders   PSA   CBC with Differential/Platelet   Hematuria, unspecified type       Relevant Orders   Urinalysis w microscopic + reflex cultur   Proteinuria, unspecified type         Frequency of urination-with recent onset of discharge-we will do a urinalysis as well as a urine culture.  Will call with results once available.  Also recommend evaluating for STDs and will send him home with a cup to do a dirty catch specimen first thing in the morning tomorrow morning and drop that off for urine GC and chlamydia testing.  BPH with nocturia-prostate significantly enlarged on exam today.  We will go ahead and check PSA since he is due for that as well as a CBC. AUA socre of 11 ( Moderate.  Would highly recommend medication at this point based on size of the gland.  We will start finasteride and Flomax.  Urethral discharge-may be secondary to blood in the urine and or kidney stones and or acute infection or possible STD.  He will return sample  tomorrow.  Proteinuria -greater than 300 protein on the urine dipstick.  Again we will evaluate for urinary tract infection.  Recommend repeat urinalysis in about 2 weeks to see if there is persistent proteinuria.  If there is then recommend additional work-up.  Last serum creatinine level was about 6 months ago.  No other underlying autoimmune diseases.  Hematuria - UA sent for micro review.     Meds ordered this encounter  Medications  . tamsulosin (FLOMAX) 0.4 MG CAPS capsule    Sig: Take 1 capsule (0.4 mg total) by mouth daily.    Dispense:  30 capsule    Refill:  3  . finasteride (PROSCAR) 5 MG tablet    Sig: Take 1 tablet (5 mg total) by mouth daily.    Dispense:  30 tablet    Refill:  3   Time spent 35 minutes in encounter.  Nani Gasser, MD

## 2019-05-31 ENCOUNTER — Other Ambulatory Visit: Payer: Self-pay | Admitting: *Deleted

## 2019-05-31 DIAGNOSIS — R369 Urethral discharge, unspecified: Secondary | ICD-10-CM

## 2019-05-31 LAB — COMPLETE METABOLIC PANEL WITH GFR
AG Ratio: 2 (calc) (ref 1.0–2.5)
ALT: 35 U/L (ref 9–46)
AST: 23 U/L (ref 10–35)
Albumin: 4.5 g/dL (ref 3.6–5.1)
Alkaline phosphatase (APISO): 54 U/L (ref 35–144)
BUN: 14 mg/dL (ref 7–25)
CO2: 28 mmol/L (ref 20–32)
Calcium: 9.3 mg/dL (ref 8.6–10.3)
Chloride: 104 mmol/L (ref 98–110)
Creat: 1.16 mg/dL (ref 0.70–1.25)
GFR, Est African American: 78 mL/min/{1.73_m2} (ref >=60)
GFR, Est Non African American: 68 mL/min/{1.73_m2} (ref >=60)
Globulin: 2.3 g/dL (calc) (ref 1.9–3.7)
Glucose, Bld: 96 mg/dL (ref 65–139)
Potassium: 4.2 mmol/L (ref 3.5–5.3)
Sodium: 138 mmol/L (ref 135–146)
Total Bilirubin: 1.6 mg/dL — ABNORMAL HIGH (ref 0.2–1.2)
Total Protein: 6.8 g/dL (ref 6.1–8.1)

## 2019-05-31 LAB — CBC
HCT: 45.8 % (ref 38.5–50.0)
Hemoglobin: 15.6 g/dL (ref 13.2–17.1)
MCH: 30.6 pg (ref 27.0–33.0)
MCHC: 34.1 g/dL (ref 32.0–36.0)
MCV: 89.8 fL (ref 80.0–100.0)
MPV: 10.3 fL (ref 7.5–12.5)
Platelets: 181 10*3/uL (ref 140–400)
RBC: 5.1 10*6/uL (ref 4.20–5.80)
RDW: 12.2 % (ref 11.0–15.0)
WBC: 6.9 10*3/uL (ref 3.8–10.8)

## 2019-05-31 LAB — PSA: PSA: 1.5 ng/mL (ref <=4.0)

## 2019-05-31 LAB — TSH: TSH: 0.76 mIU/L (ref 0.40–4.50)

## 2019-06-01 LAB — C. TRACHOMATIS/N. GONORRHOEAE RNA
C. trachomatis RNA, TMA: NOT DETECTED
N. gonorrhoeae RNA, TMA: NOT DETECTED

## 2019-06-01 LAB — URINALYSIS W MICROSCOPIC + REFLEX CULTURE
Bilirubin Urine: NEGATIVE
Glucose, UA: NEGATIVE
Hyaline Cast: NONE SEEN /LPF
Ketones, ur: NEGATIVE
Nitrites, Initial: NEGATIVE
RBC / HPF: >=60 /HPF — AB (ref 0–2)
Specific Gravity, Urine: 1.017 (ref 1.001–1.03)
Squamous Epithelial / HPF: NONE SEEN /HPF (ref <=5)
WBC, UA: >=60 /HPF — AB (ref 0–5)
pH: 7.5 (ref 5.0–8.0)

## 2019-06-01 LAB — URINE CULTURE
MICRO NUMBER:: 10139387
SPECIMEN QUALITY:: ADEQUATE

## 2019-06-01 LAB — CULTURE INDICATED

## 2019-06-04 MED ORDER — NITROFURANTOIN MONOHYD MACRO 100 MG PO CAPS: 100.0000 mg | ORAL_CAPSULE | Freq: Two times a day (BID) | ORAL | 0 refills | Status: DC

## 2019-06-04 NOTE — Addendum Note (Signed)
Addended by: Nani Gasser D on: 06/04/2019 12:59 PM   Modules accepted: Orders

## 2019-06-08 ENCOUNTER — Ambulatory Visit (INDEPENDENT_AMBULATORY_CARE_PROVIDER_SITE_OTHER): Payer: BC Managed Care – PPO | Admitting: Family Medicine

## 2019-06-08 ENCOUNTER — Other Ambulatory Visit: Payer: Self-pay

## 2019-06-08 VITALS — BP 129/82 | HR 70 | Temp 97.9°F

## 2019-06-08 DIAGNOSIS — I700 Atherosclerosis of aorta: Secondary | ICD-10-CM | POA: Diagnosis not present

## 2019-06-08 DIAGNOSIS — R0602 Shortness of breath: Secondary | ICD-10-CM | POA: Diagnosis not present

## 2019-06-08 DIAGNOSIS — N3000 Acute cystitis without hematuria: Secondary | ICD-10-CM

## 2019-06-08 MED ORDER — ALBUTEROL SULFATE HFA 108 (90 BASE) MCG/ACT IN AERS
2.0000 | INHALATION_SPRAY | Freq: Once | RESPIRATORY_TRACT | Status: AC
  Administered 2019-06-08: 2 via RESPIRATORY_TRACT

## 2019-06-08 NOTE — Progress Notes (Addendum)
Established Patient Office Visit  Subjective:  Patient ID: Jake Pearson, male    DOB: December 26, 1955  Age: 64 y.o. MRN: [id number]  CC:  Chief Complaint  Patient presents with  . Spirometry    HPI Jake Pearson presents for spirometry.  He was seen initially on February 5 for a virtual visit for some intermittent shortness of breath have been going on for about 2 years but he felt like it was getting progressively more frequent to the point that it was daily.  He said that he had gone for hike in early January and just felt very short of breath on a trail that he normally takes and does not have significant difficulty with.  He had started a PPI about 2 weeks prior to that because of some reflux.  He also did report some intermittent postnasal drip but denies any chest pain.  Have a CT back in August which showed a small pleural effusion and he has never been a smoker.  He has noticed some improvement in the shortness of breath since taking the PPI.  That has not completely resolved it.  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  In regards to his urinary symptoms for which she was seen on February 11 that seems to have improved significantly since taking the Macrobid.  He was negative for gonorrhea and chlamydia as it also complained of a slight urethral discharge.  Urine culture was positive for Enterococcus faecalis.  Past Medical History:  Diagnosis Date  . Enlarged prostate   . History of nephrolithiasis   . Mid back pain 10/13/2017    Past Surgical History:  Procedure Laterality Date  . ARTHROSCOPIC REPAIR ACL    . KNEE ARTHROCENTESIS    . TONSILLECTOMY  Age 62   . VASECTOMY      Family History  Problem Relation Age of Onset  . Diabetes Maternal Uncle   . Diabetes Maternal Aunt   . Heart attack Maternal Grandfather   . Heart attack Maternal Uncle     Social History   Socioeconomic History  . Marital  status: Married    Spouse name: Aram Beecham  . Number of children: 2  . Years of education: Not on file  . Highest education level: Not on file  Occupational History    Employer: [employer]  Tobacco Use  . Smoking status: Never Smoker  . Smokeless tobacco: Never Used  Substance and Sexual Activity  . Alcohol use: Yes    Alcohol/week: 1.0 standard drinks    Types: 1 drink(s) per week  . Drug use: No  . Sexual activity: Yes    Partners: Female  Other Topics Concern  . 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    Social Determinants of Health   Financial Resource Strain:   . Difficulty of Paying Living Expenses: Not on file  Food Insecurity:   . Worried About Programme researcher, broadcasting/film/video in the Last Year: Not on file  . Ran Out of Food in the Last Year: Not on file  Transportation Needs:   . Lack of Transportation (Medical): Not on file  . Lack of Transportation (Non-Medical): Not on file  Physical Activity:   . Days of Exercise per Week: Not on file  . Minutes of Exercise per Session: Not  on file  Stress:   . Feeling of Stress : Not on file  Social Connections:   . Frequency of Communication with Friends and Family: Not on file  . Frequency of Social Gatherings with Friends and Family: Not on file  . Attends Religious Services: Not on file  . Active Member of Clubs or Organizations: Not on file  . Attends Archivist Meetings: Not on file  . Marital Status: Not on file  Intimate Partner Violence:   . Fear of Current or Ex-Partner: Not on file  . Emotionally Abused: Not on file  . Physically Abused: Not on file  . Sexually Abused: Not on file    Outpatient Medications Prior to Visit  Medication Sig Dispense Refill  . finasteride (PROSCAR) 5 MG tablet Take 1 tablet (5 mg total) by mouth daily. 30 tablet 3  . fluticasone (FLONASE) 50 MCG/ACT nasal spray Place 2 sprays into both nostrils daily. 16  g 0  . omeprazole (PRILOSEC) 40 MG capsule Take 1 capsule (40 mg total) by mouth daily. 30 capsule 2  . tamsulosin (FLOMAX) 0.4 MG CAPS capsule Take 1 capsule (0.4 mg total) by mouth daily. 30 capsule 3  . nitrofurantoin, macrocrystal-monohydrate, (MACROBID) 100 MG capsule Take 1 capsule (100 mg total) by mouth 2 (two) times daily. 10 capsule 0   No facility-administered medications prior to visit.    No Known Allergies  ROS Review of Systems    Objective:    Physical Exam  Constitutional: He is oriented to person, place, and time. He appears well-developed and well-nourished.  HENT:  Head: Normocephalic and atraumatic.  Eyes: Conjunctivae and EOM are normal.  Cardiovascular: Normal rate.  Pulmonary/Chest: Effort normal.  Neurological: He is alert and oriented to person, place, and time.  Skin: Skin is dry. No pallor.  Psychiatric: He has a normal mood and affect. His behavior is normal.  Vitals reviewed.   BP 129/82   Pulse 70   Temp 97.9 F (36.6 C) (Oral)   SpO2 100%  Wt Readings from Last 3 Encounters:  05/30/19 177 lb (80.3 kg)  03/30/19 170 lb (77.1 kg)  12/27/18 174 lb (78.9 kg)     There are no preventive care reminders to display for this patient.  There are no preventive care reminders to display for this patient.  Lab Results  Component Value Date   TSH 0.76 05/30/2019   Lab Results  Component Value Date   WBC 6.9 05/30/2019   HGB 15.6 05/30/2019   HCT 45.8 05/30/2019   MCV 89.8 05/30/2019   PLT 181 05/30/2019   Lab Results  Component Value Date   NA 138 05/30/2019   K 4.2 05/30/2019   CO2 28 05/30/2019   GLUCOSE 96 05/30/2019   BUN 14 05/30/2019   CREATININE 1.16 05/30/2019   BILITOT 1.6 (H) 05/30/2019   ALKPHOS 52 10/25/2012   AST 23 05/30/2019   ALT 35 05/30/2019   PROT 6.8 05/30/2019   ALBUMIN 4.2 10/25/2012   CALCIUM 9.3 05/30/2019   Lab Results  Component Value Date   CHOL 167 12/07/2018   Lab Results  Component Value Date    HDL 39 (L) 12/07/2018   Lab Results  Component Value Date   LDLCALC 107 (H) 12/07/2018   Lab Results  Component Value Date   TRIG 114 12/07/2018   Lab Results  Component Value Date   CHOLHDL 4.3 12/07/2018   No results found for: HGBA1C    Assessment & Plan:  Problem List Items Addressed This Visit      Cardiovascular and Mediastinum   Aortic atherosclerosis (HCC)   Relevant Orders   ECHOCARDIOGRAM COMPLETE   Exercise Tolerance Test    Other Visit Diagnoses    SOB (shortness of breath)    -  Primary   Relevant Medications   albuterol (VENTOLIN HFA) 108 (90 Base) MCG/ACT inhaler 2 puff (Completed)   Other Relevant Orders   ECHOCARDIOGRAM COMPLETE   Exercise Tolerance Test   Acute cystitis without hematuria         Spirometry today shows FVC of 82% with an FEV1 of 90% and a ratio of 82.  No significant change postbronchodilator.  Interpretation normal.  Reviewed these results with him today.  I think this effectively rules out any significant pulmonary cause.  He does have just a little bit of mild restriction with a slightly low FVC based on his age height etc.  So we did discuss activity level as well as weight loss.  At this point we will move forward with scheduling echocardiogram as well as azygous exercise treadmill test to further work-up the shortness of breath.  Urged him to continue with his PPI since he has noticed some improvement in the shortness of breath since being on the PPI.  Urinary tract infection-doing well on the Macrobid with almost complete resolution of symptoms.  Meds ordered this encounter  Medications  . albuterol (VENTOLIN HFA) 108 (90 Base) MCG/ACT inhaler 2 puff    Follow-up: No follow-ups on file.    Nani Gasser, MD

## 2019-06-08 NOTE — Addendum Note (Signed)
Addended by: Nani Gasser D on: 06/08/2019 06:04 PM   Modules accepted: Orders, Level of Service

## 2019-06-08 NOTE — Progress Notes (Signed)
Patient presents as a nurse visit for Spirometry testing.   Initial testing: 3/3 attempts achieved. Administered 2 inhalations of albuterol sulfate 9.mcg.inhalation. Pt tolerated inhalations well.  Post testing, 3 minutes after albuterol inhalations: 3/3 attempts achieved.   Patient taken to one of Dr. Shelah Lewandowsky exam rooms, and reports given to [facility].

## 2019-06-20 ENCOUNTER — Other Ambulatory Visit: Payer: Self-pay | Admitting: Physician Assistant

## 2019-06-20 ENCOUNTER — Ambulatory Visit (HOSPITAL_BASED_OUTPATIENT_CLINIC_OR_DEPARTMENT_OTHER)
Admission: RE | Admit: 2019-06-20 | Discharge: 2019-06-20 | Disposition: A | Discharge status: Home / Self Care | Payer: BC Managed Care – PPO | Source: Ambulatory Visit | Attending: Family Medicine | Admitting: Family Medicine

## 2019-06-20 DIAGNOSIS — K30 Functional dyspepsia: Secondary | ICD-10-CM

## 2019-06-20 DIAGNOSIS — I700 Atherosclerosis of aorta: Secondary | ICD-10-CM | POA: Diagnosis present

## 2019-06-20 DIAGNOSIS — R0602 Shortness of breath: Secondary | ICD-10-CM | POA: Insufficient documentation

## 2019-06-20 DIAGNOSIS — K2100 Gastro-esophageal reflux disease with esophagitis, without bleeding: Secondary | ICD-10-CM

## 2019-06-20 NOTE — Progress Notes (Signed)
  Echocardiogram 2D Echocardiogram has been performed.  Jomarie Longs 06/20/2019, 3:56 PM

## 2019-06-26 ENCOUNTER — Telehealth: Payer: Self-pay

## 2019-06-26 DIAGNOSIS — N3000 Acute cystitis without hematuria: Secondary | ICD-10-CM

## 2019-06-26 NOTE — Telephone Encounter (Signed)
UA and urine culture ordered.   Results -  Hi Jake Pearson, your urine culture came back positive for bacteria called Enterococcus. Get a send over prescription for nitrofurantoin. Once you have completed your antibiotic and you have been off completely for 1 week then I would like to have you come back to repeat a urinalysis and culture just to confirm that we have cleared up the infection and to make sure that the protein and blood in your urine have cleared as well.

## 2019-06-28 NOTE — Addendum Note (Signed)
Addended by: Chalmers Cater on: 06/28/2019 11:46 AM   Modules accepted: Orders

## 2019-07-02 ENCOUNTER — Other Ambulatory Visit: Payer: Self-pay | Admitting: *Deleted

## 2019-07-02 DIAGNOSIS — N390 Urinary tract infection, site not specified: Secondary | ICD-10-CM

## 2019-07-02 DIAGNOSIS — N200 Calculus of kidney: Secondary | ICD-10-CM

## 2019-07-02 LAB — URINE CULTURE
MICRO NUMBER:: 10247449
SPECIMEN QUALITY:: ADEQUATE

## 2019-07-02 LAB — URINALYSIS, ROUTINE W REFLEX MICROSCOPIC
Bilirubin Urine: NEGATIVE
Glucose, UA: NEGATIVE
Ketones, ur: NEGATIVE
Nitrite: NEGATIVE
Specific Gravity, Urine: 1.019 (ref 1.001–1.03)
Squamous Epithelial / HPF: NONE SEEN /HPF (ref <=5)
pH: 6 (ref 5.0–8.0)

## 2020-01-18 DIAGNOSIS — U071 COVID-19: Secondary | ICD-10-CM

## 2020-01-18 HISTORY — DX: COVID-19: U07.1

## 2020-02-19 ENCOUNTER — Encounter: Payer: Self-pay | Admitting: Family Medicine

## 2020-02-19 ENCOUNTER — Telehealth (INDEPENDENT_AMBULATORY_CARE_PROVIDER_SITE_OTHER): Payer: BC Managed Care – PPO | Admitting: Family Medicine

## 2020-02-19 VITALS — HR 70

## 2020-02-19 DIAGNOSIS — R748 Abnormal levels of other serum enzymes: Secondary | ICD-10-CM | POA: Diagnosis not present

## 2020-02-19 DIAGNOSIS — U071 COVID-19: Secondary | ICD-10-CM | POA: Diagnosis not present

## 2020-02-19 DIAGNOSIS — E876 Hypokalemia: Secondary | ICD-10-CM | POA: Diagnosis not present

## 2020-02-19 MED ORDER — AZITHROMYCIN 250 MG PO TABS: ORAL_TABLET | ORAL | 0 refills | Status: DC

## 2020-02-19 NOTE — Progress Notes (Signed)
Spoke w/pt and he reports that he feels extremely weak. He has waves of nausea. And has been unable to sleep more than one hour at a time.   Reports that his mouth is very dry and feels like there is something stuck on the back of his throat.

## 2020-02-19 NOTE — Progress Notes (Addendum)
Virtual Visit via Telephone Note  I connected with Jake Pearson on 02/20/20 at 10:10 AM EDT by a video enabled telemedicine application but unable to get the video to completely connect so switched to telephone visit and verified that I am speaking with the correct person using two identifiers.   I discussed the limitations of evaluation and management by telemedicine and the availability of in person appointments. The patient expressed understanding and agreed to proceed.  Patient location: at home  Provider location: in office  Subjective:    CC: COVID 23   HPI: 64 year old male is doing a virtual visit today for COVID-19.  He actually tested positive on October 23.  His family members have been sick as well he went to the emergency department because of shortness of breath and low blood oxygen.  He reported that his home pulse ox meter was reading between 92 and 93%.  He had had a fever initially but that seemed to have resolved.  He also had a nonproductive cough at that time.  He was not vaccinated.  He was also having some abdominal pain and discomfort.  Chest x-ray did show bilateral bibasilar opacities representing possible atelectasis versus infiltrate.  He was given Zofran and discharged home.  His potassium was mildly low at 3.5 and his AST liver enzyme was also mildly elevated. Feels like he is hydrating well.   No vomiting.  Chest feels sore anteriorly, bilateral.  Still coughing a lot. Cough is productive, creamy colored.  No fever.  94%.  No SOB.  Some diarrhea  Spoke w/pt and he reports that he feels extremely weak. He has waves of nausea. And has been unable to sleep more than one hour at a time.   Reports that his mouth is very dry and feels like there is something stuck on the back of his throat.   Past medical history, Surgical history, Family history not pertinant except as noted below, Social history, Allergies, and medications have been entered into the medical record,  reviewed, and corrections made.   Review of Systems: No fevers, chills, night sweats, weight loss, chest pain, or shortness of breath.   Objective:    General: Speaking clearly in complete sentences without any shortness of breath.  Alert and oriented x3.  Normal judgment. No apparent acute distress.    Impression and Recommendations:    No problem-specific Assessment & Plan notes found for this encounter.   COVID - 19  -still recovering.  He has been symptomatic for almost 2 weeks at this point still has a wet productive cough and still has extreme fatigue.  He was noted to have possible bibasilar infiltrates on chest x-ray.  He does not feel like he is worse but does not feel significantly better.  We discussed that this is viral and will likely take time to improve but we did discuss we could consider a trial of azithromycin at this point since it is taking a while for him to get better to rule out a secondary infection which could be occurring.  Prescription sent to pharmacy.  As long as he is improving okay to return to work on Monday, November 8.  Cough - see note above.  Elevated liver enzymes - plan to recheck at the end of the week.  Hypokalemia - plan to recheck at the end of the week.   Time spent in encounter 21 minutes  I discussed the assessment and treatment plan with the patient. The patient was provided  an opportunity to ask questions and all were answered. The patient agreed with the plan and demonstrated an understanding of the instructions.   The patient was advised to call back or seek an in-person evaluation if the symptoms worsen or if the condition fails to improve as anticipated.   Nani Gasser, MD

## 2020-02-22 ENCOUNTER — Encounter: Payer: Self-pay | Admitting: Nurse Practitioner

## 2020-02-22 ENCOUNTER — Telehealth: Payer: Self-pay

## 2020-02-22 ENCOUNTER — Ambulatory Visit (INDEPENDENT_AMBULATORY_CARE_PROVIDER_SITE_OTHER): Payer: BC Managed Care – PPO | Admitting: Nurse Practitioner

## 2020-02-22 ENCOUNTER — Other Ambulatory Visit: Payer: Self-pay

## 2020-02-22 ENCOUNTER — Ambulatory Visit (INDEPENDENT_AMBULATORY_CARE_PROVIDER_SITE_OTHER): Payer: BC Managed Care – PPO

## 2020-02-22 VITALS — BP 116/79 | HR 75 | Temp 97.9°F | Ht 68.0 in | Wt 157.5 lb

## 2020-02-22 DIAGNOSIS — R0789 Other chest pain: Secondary | ICD-10-CM

## 2020-02-22 DIAGNOSIS — Z8616 Personal history of COVID-19: Secondary | ICD-10-CM | POA: Diagnosis not present

## 2020-02-22 DIAGNOSIS — R059 Cough, unspecified: Secondary | ICD-10-CM | POA: Diagnosis not present

## 2020-02-22 DIAGNOSIS — J1282 Pneumonia due to coronavirus disease 2019: Secondary | ICD-10-CM | POA: Diagnosis not present

## 2020-02-22 DIAGNOSIS — R110 Nausea: Secondary | ICD-10-CM

## 2020-02-22 DIAGNOSIS — U071 COVID-19: Secondary | ICD-10-CM

## 2020-02-22 MED ORDER — ALBUTEROL SULFATE HFA 108 (90 BASE) MCG/ACT IN AERS: 1.0000 | INHALATION_SPRAY | RESPIRATORY_TRACT | 1 refills | Status: DC | PRN

## 2020-02-22 NOTE — Telephone Encounter (Signed)
Jake Pearson states he was advised to call back if he has new or worsening of symptoms. He reports hot flashes, persistant tightness in chest and nausea. Denies shortness of breath, trouble breathing or vomiting. He did not sound out of breath or trouble breathing.    I did advise-  When to Seek Emergency Medical Attention Look for emergency warning signs* for COVID-19. If someone is showing any of these signs, seek emergency medical care immediately  Trouble breathing  New confusion  Inability to wake or stay awake Bluish lips or face

## 2020-02-22 NOTE — Telephone Encounter (Signed)
Patient scheduled for an in office visit for chest tightness.

## 2020-02-22 NOTE — Telephone Encounter (Signed)
Please schedule patient for appointment.  If we do not have anything today we can try to get him scheduled for Monday and get him warning signs and symptoms for over the weekend.  He could be experiencing some secondary symptoms of Covid such as myocarditis etc.

## 2020-02-22 NOTE — Assessment & Plan Note (Addendum)
Chest pressure and tightness noted at the third intercostal space left of the sternum along the midclavicular line unable to reproduce with palpation.  I do not feel his symptoms are cardiac in nature given his presentation and description. Given that he is not experiencing dyspnea with exertion or at rest I feel the likelihood of PE is very low.  Strongly suspicious for possible secondary bacterial infection related to recent COVID-19 virus.  Plan for chest x-ray today to evaluate for the possibility of pneumonia. Alternatively this could be chest wall pain related to frequent coughing related to COVID-19. Lab results and notes from previous hospital visit and follow-up visit reviewed.  We will hold off on obtaining more labs today.  Previous chest x-ray did show bibasilar opacities unable to view images today due to these being taken outside of the system. Discussed with patient symptom management to include ice and heat to the chest wall and NSAIDs for discomfort and suspected fevers. Went over warning signs that would warrant immediate evaluation in the emergency room. We will notify patient of chest x-ray results and make changes to the plan of care as necessary.  Low threshold for beginning antibiotic therapy for secondary based on chest x-ray results. Patient to follow-up if symptoms worsen or fail to improve.

## 2020-02-22 NOTE — Assessment & Plan Note (Signed)
COVID-19 diagnosis 10/23 with symptoms onset 10/18-10/19. He still appears to be dealing with effects of COVID-19 including altered sensation in taste, cough, sinus pressure, and fatigue/weakness. Encourage continued rest and increasing activity as tolerated.  Discussed with patient that COVID-19 symptoms may often take several weeks to months to resolve but if he continues to have issues to please let us know.

## 2020-02-22 NOTE — Progress Notes (Signed)
Acute Office Visit  Subjective:    Patient ID: Jake Pearson, male    DOB: 07/15/1955, 64 y.o.   MRN: [id number]  Chief Complaint  Patient presents with  . Chest Pain    left upper chest area    HPI Jake Pearson is a pleasant 64 year old male presenting today with left sided chest wall pain, intermittent nausea, change in taste, and waking up with "hot and cold" flashes in the night. He was diagnosed with COVID-19 about 2 weeks ago.   He tells me he first noticed the chest wall pain last night. He tells me if feels better when he is laying down. It is a constant "tightness and pressure" at approximately the 3rd intercostal space left of the sternum along the midclavicular line. He is checking his O2 at home and tells me it is running around 93-96% at rest.   He has not taken his temperature, but reports that he has been having intermittent chills and hot flashes and that he woke last night several times in a sweat.   He denies shortness of breath, dizziness, feelings of impending doom, radiating pain into his shoulder, left arm, neck, or back, or worsening pain with coughing or increased chest pressures.   He also tells me that he is dealing with intermittent nausea, but this is improved and helped with the zofran. He tells me that he does not have much of an appetite and that certain foods do not taste right to him. He tells me that meat tastes like it has gone bad.   He also reports some ongoing post-nasal drip, fatigue, body aches, and frontal sinus pressure. He tells me that he "just can't seem to get over" the virus.    Past Medical History:  Diagnosis Date  . Enlarged prostate   . History of nephrolithiasis   . Mid back pain 10/13/2017    Past Surgical History:  Procedure Laterality Date  . ARTHROSCOPIC REPAIR ACL    . KNEE ARTHROCENTESIS    . TONSILLECTOMY  Age 29   . VASECTOMY      Family History  Problem Relation Age of Onset  . Diabetes Maternal Uncle   . Diabetes  Maternal Aunt   . Heart attack Maternal Grandfather   . Heart attack Maternal Uncle     Social History   Socioeconomic History  . Marital status: Married    Spouse name: Aram Beecham  . Number of children: 2  . Years of education: Not on file  . Highest education level: Not on file  Occupational History    Employer: [employer]  Tobacco Use  . Smoking status: Never Smoker  . Smokeless tobacco: Never Used  Substance and Sexual Activity  . Alcohol use: Yes    Alcohol/week: 1.0 standard drink    Types: 1 drink(s) per week  . Drug use: No  . Sexual activity: Yes    Partners: Female  Other Topics Concern  . 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    Social Determinants of Health   Financial Resource Strain:   . Difficulty of Paying Living Expenses: Not on file  Food Insecurity:   . Worried About Programme researcher, broadcasting/film/video in the Last Year: Not on file  . Ran Out of Food in the Last Year: Not on file  Transportation Needs:   . Lack of Transportation (Medical): Not on file  .  Lack of Transportation (Non-Medical): Not on file  Physical Activity:   . Days of Exercise per Week: Not on file  . Minutes of Exercise per Session: Not on file  Stress:   . Feeling of Stress : Not on file  Social Connections:   . Frequency of Communication with Friends and Family: Not on file  . Frequency of Social Gatherings with Friends and Family: Not on file  . Attends Religious Services: Not on file  . Active Member of Clubs or Organizations: Not on file  . Attends Banker Meetings: Not on file  . Marital Status: Not on file  Intimate Partner Violence:   . Fear of Current or Ex-Partner: Not on file  . Emotionally Abused: Not on file  . Physically Abused: Not on file  . Sexually Abused: Not on file    Outpatient Medications Prior to Visit  Medication Sig Dispense Refill  . ondansetron (ZOFRAN-ODT) 4 MG  disintegrating tablet Take 4 mg by mouth every 8 (eight) hours as needed.     . finasteride (PROSCAR) 5 MG tablet Take 1 tablet (5 mg total) by mouth daily. (Patient not taking: Reported on 02/22/2020) 30 tablet 3  . fluticasone (FLONASE) 50 MCG/ACT nasal spray Place 2 sprays into both nostrils daily. (Patient not taking: Reported on 02/22/2020) 16 g 0  . omeprazole (PRILOSEC) 40 MG capsule Take 1 capsule (40 mg total) by mouth daily. (Patient not taking: Reported on 02/22/2020) 90 capsule 1  . tamsulosin (FLOMAX) 0.4 MG CAPS capsule Take 1 capsule (0.4 mg total) by mouth daily. (Patient not taking: Reported on 02/22/2020) 30 capsule 3   No facility-administered medications prior to visit.    No Known Allergies  Review of Systems All review of systems negative except what is listed in the HPI     Objective:    Physical Exam Vitals and nursing note reviewed.  Constitutional:      Appearance: He is well-developed and normal weight.  HENT:     Head: Normocephalic.  Eyes:     Extraocular Movements: Extraocular movements intact.     Pupils: Pupils are equal, round, and reactive to light.  Cardiovascular:     Rate and Rhythm: Normal rate and regular rhythm.  No extrasystoles are present.    Chest Wall: PMI is not displaced.     Pulses:          Carotid pulses are 2+ on the right side and 2+ on the left side.      Radial pulses are 2+ on the right side and 2+ on the left side.       Posterior tibial pulses are 2+ on the right side and 2+ on the left side.     Heart sounds: Normal heart sounds. Heart sounds not distant. No murmur heard.  No systolic murmur is present.  No diastolic murmur is present.  No friction rub.  Pulmonary:     Effort: Pulmonary effort is normal. No tachypnea, accessory muscle usage or respiratory distress.     Breath sounds: Examination of the left-upper field reveals rhonchi. Examination of the left-middle field reveals rhonchi. Examination of the right-lower field  reveals decreased breath sounds. Examination of the left-lower field reveals decreased breath sounds. Decreased breath sounds and rhonchi present.  Chest:     Chest wall: No mass, deformity, tenderness, crepitus or edema. There is no dullness to percussion.    Abdominal:     General: Bowel sounds are normal.  Palpations: Abdomen is soft.  Musculoskeletal:     Cervical back: Normal range of motion.     Right lower leg: No edema.     Left lower leg: No edema.  Lymphadenopathy:     Cervical: No cervical adenopathy.  Skin:    General: Skin is warm and dry.     Capillary Refill: Capillary refill takes less than 2 seconds.  Neurological:     General: No focal deficit present.     Mental Status: He is alert and oriented to person, place, and time.  Psychiatric:        Mood and Affect: Mood normal.        Behavior: Behavior normal.     BP 116/79   Pulse 75   Temp 97.9 F (36.6 C) (Oral)   Ht 5\' 8"  (1.727 m)   Wt 157 lb 8 oz (71.4 kg)   SpO2 95%   BMI 23.95 kg/m  Wt Readings from Last 3 Encounters:  02/22/20 157 lb 8 oz (71.4 kg)  05/30/19 177 lb (80.3 kg)  03/30/19 170 lb (77.1 kg)    There are no preventive care reminders to display for this patient.  There are no preventive care reminders to display for this patient.   Lab Results  Component Value Date   TSH 0.76 05/30/2019   Lab Results  Component Value Date   WBC 6.9 05/30/2019   HGB 15.6 05/30/2019   HCT 45.8 05/30/2019   MCV 89.8 05/30/2019   PLT 181 05/30/2019   Lab Results  Component Value Date   NA 138 05/30/2019   K 4.2 05/30/2019   CO2 28 05/30/2019   GLUCOSE 96 05/30/2019   BUN 14 05/30/2019   CREATININE 1.16 05/30/2019   BILITOT 1.6 (H) 05/30/2019   ALKPHOS 52 10/25/2012   AST 23 05/30/2019   ALT 35 05/30/2019   PROT 6.8 05/30/2019   ALBUMIN 4.2 10/25/2012   CALCIUM 9.3 05/30/2019   Lab Results  Component Value Date   CHOL 167 12/07/2018   Lab Results  Component Value Date   HDL  39 (L) 12/07/2018   Lab Results  Component Value Date   LDLCALC 107 (H) 12/07/2018   Lab Results  Component Value Date   TRIG 114 12/07/2018   Lab Results  Component Value Date   CHOLHDL 4.3 12/07/2018   No results found for: HGBA1C     Assessment & Plan:   Problem List Items Addressed This Visit      Other   Cough    Ongoing cough in the setting of recent COVID-19 diagnosis. Given the patient's exam and chest discomfort will obtain chest x-ray today to rule out the possibility of pneumonia. Albuterol inhaler ordered today for symptoms of cough, wheezing, or tightness in chest. Patient instructed on appropriate use of medication. Follow-up if symptoms worsen or fail to improve.      Sensation of chest tightness - Primary    Chest pressure and tightness noted at the third intercostal space left of the sternum along the midclavicular line unable to reproduce with palpation.  I do not feel his symptoms are cardiac in nature given his presentation and description. Given that he is not experiencing dyspnea with exertion or at rest I feel the likelihood of PE is very low.  Strongly suspicious for possible secondary bacterial infection related to recent COVID-19 virus.  Plan for chest x-ray today to evaluate for the possibility of pneumonia. Alternatively this could be chest wall pain  related to frequent coughing related to COVID-19. Lab results and notes from previous hospital visit and follow-up visit reviewed.  We will hold off on obtaining more labs today.  Previous chest x-ray did show bibasilar opacities unable to view images today due to these being taken outside of the system. Discussed with patient symptom management to include ice and heat to the chest wall and NSAIDs for discomfort and suspected fevers. Went over warning signs that would warrant immediate evaluation in the emergency room. We will notify patient of chest x-ray results and make changes to the plan of care as  necessary.  Low threshold for beginning antibiotic therapy for secondary based on chest x-ray results. Patient to follow-up if symptoms worsen or fail to improve.       Relevant Medications   albuterol (VENTOLIN HFA) 108 (90 Base) MCG/ACT inhaler   Other Relevant Orders   DG Chest 2 View   Nausea    Intermittent nausea and decreased appetite due to COVID-19 infection. Continue Zofran for nausea as needed. Proteins or meal replacement shakes may be helpful to ensure that caloric intake is adequate. If symptoms persist please let us know.      Personal history of COVID-19    COVID-19 diagnosis 10/23 with symptoms onset 10/18-10/19. He still appears to be dealing with effects of COVID-19 including altered sensation in taste, cough, sinus pressure, and fatigue/weakness. Encourage continued rest and increasing activity as tolerated.  Discussed with patient that COVID-19 symptoms may often take several weeks to months to resolve but if he continues to have issues to please let us know.          Meds ordered this encounter  Medications  . albuterol (VENTOLIN HFA) 108 (90 Base) MCG/ACT inhaler    Sig: Inhale 1-2 puffs into the lungs every 4 (four) hours as needed for wheezing or shortness of breath.    Dispense:  6.7 g    Refill:  1     Tollie Eth, NP

## 2020-02-22 NOTE — Assessment & Plan Note (Addendum)
Ongoing cough in the setting of recent COVID-19 diagnosis. Given the patient's exam and chest discomfort will obtain chest x-ray today to rule out the possibility of pneumonia. Albuterol inhaler ordered today for symptoms of cough, wheezing, or tightness in chest. Patient instructed on appropriate use of medication. Follow-up if symptoms worsen or fail to improve.

## 2020-02-22 NOTE — Patient Instructions (Addendum)
I would like to get an xray today to make sure that we don't see any lingering pneumonia from COVID.   I will give you a call when the results come in. In the meantime, you can use ibuprofen (400 - 600 mg every 6 hours) for pain and alternate ice and heat to the area of discomfort.      Chest Wall Pain Chest wall pain is pain in or around the bones and muscles of your chest. Chest wall pain may be caused by:  An injury.  Coughing a lot.  Using your chest and arm muscles too much. Sometimes, the cause may not be known. This pain may take a few weeks or longer to get better. Follow these instructions at home: Managing pain, stiffness, and swelling If told, put ice on the painful area:  Put ice in a plastic bag.  Place a towel between your skin and the bag.  Leave the ice on for 20 minutes, 2-3 times a day.  Activity  Rest as told by your doctor.  Avoid doing things that cause pain. This includes lifting heavy items.  Ask your doctor what activities are safe for you. General instructions   Take over-the-counter and prescription medicines only as told by your doctor.  Do not use any products that contain nicotine or tobacco, such as cigarettes, e-cigarettes, and chewing tobacco. If you need help quitting, ask your doctor.  Keep all follow-up visits as told by your doctor. This is important. Contact a doctor if:  You have a fever.  Your chest pain gets worse.  You have new symptoms. Get help right away if:  You feel sick to your stomach (nauseous) or you throw up (vomit).  You feel sweaty or light-headed.  You have a cough with mucus from your lungs (sputum) or you cough up blood.  You are short of breath. These symptoms may be an emergency. Do not wait to see if the symptoms will go away. Get medical help right away. Call your local emergency services (911 in the [place] Do not drive yourself to the hospital. Summary  Chest wall pain is pain in or around the  bones and muscles of your chest.  It may be treated with ice, rest, and medicines. Your condition may also get better if you avoid doing things that cause pain.  Contact a doctor if you have a fever, chest pain that gets worse, or new symptoms.  Get help right away if you feel light-headed or you get short of breath. These symptoms may be an emergency. This information is not intended to replace advice given to you by your health care provider. Make sure you discuss any questions you have with your health care provider. Document Revised: 10/06/2017 Document Reviewed: 10/06/2017 Elsevier Patient Education  2020 Elsevier Inc.    Nonspecific Chest Pain Chest pain can be caused by many different conditions. Some causes of chest pain can be life-threatening. These will require treatment right away. Serious causes of chest pain include:  Heart attack.  A tear in the body's main blood vessel.  Redness and swelling (inflammation) around your heart.  Blood clot in your lungs. Other causes of chest pain may not be so serious. These include:  Heartburn.  Anxiety or stress.  Damage to bones or muscles in your chest.  Lung infections. Chest pain can feel like:  Pain or discomfort in your chest.  Crushing, pressure, aching, or squeezing pain.  Burning or tingling.  Dull or sharp  pain that is worse when you move, cough, or take a deep breath.  Pain or discomfort that is also felt in your back, neck, jaw, shoulder, or arm, or pain that spreads to any of these areas. It is hard to know whether your pain is caused by something that is serious or something that is not so serious. So it is important to see your doctor right away if you have chest pain. Follow these instructions at home: Medicines  Take over-the-counter and prescription medicines only as told by your doctor.  If you were prescribed an antibiotic medicine, take it as told by your doctor. Do not stop taking the antibiotic  even if you start to feel better. Lifestyle   Rest as told by your doctor.  Do not use any products that contain nicotine or tobacco, such as cigarettes, e-cigarettes, and chewing tobacco. If you need help quitting, ask your doctor.  Do not drink alcohol.  Make lifestyle changes as told by your doctor. These may include: ? Getting regular exercise. Ask your doctor what activities are safe for you. ? Eating a heart-healthy diet. A diet and nutrition specialist (dietitian) can help you to learn healthy eating options. ? Staying at a healthy weight. ? Treating diabetes or high blood pressure, if needed. ? Lowering your stress. Activities such as yoga and relaxation techniques can help. General instructions  Pay attention to any changes in your symptoms. Tell your doctor about them or any new symptoms.  Avoid any activities that cause chest pain.  Keep all follow-up visits as told by your doctor. This is important. You may need more testing if your chest pain does not go away. Contact a doctor if:  Your chest pain does not go away.  You feel depressed.  You have a fever. Get help right away if:  Your chest pain is worse.  You have a cough that gets worse, or you cough up blood.  You have very bad (severe) pain in your belly (abdomen).  You pass out (faint).  You have either of these for no clear reason: ? Sudden chest discomfort. ? Sudden discomfort in your arms, back, neck, or jaw.  You have shortness of breath at any time.  You suddenly start to sweat, or your skin gets clammy.  You feel sick to your stomach (nauseous).  You throw up (vomit).  You suddenly feel lightheaded or dizzy.  You feel very weak or tired.  Your heart starts to beat fast, or it feels like it is skipping beats. These symptoms may be an emergency. Do not wait to see if the symptoms will go away. Get medical help right away. Call your local emergency services (911 in the [place] Do not drive  yourself to the hospital. Summary  Chest pain can be caused by many different conditions. The cause may be serious and need treatment right away. If you have chest pain, see your doctor right away.  Follow your doctor's instructions for taking medicines and making lifestyle changes.  Keep all follow-up visits as told by your doctor. This includes visits for any further testing if your chest pain does not go away.  Be sure to know the signs that show that your condition has become worse. Get help right away if you have these symptoms. This information is not intended to replace advice given to you by your health care provider. Make sure you discuss any questions you have with your health care provider. Document Revised: 10/06/2017 Document Reviewed:  10/06/2017 Elsevier Patient Education  2020 Elsevier Inc.    Community-Acquired Pneumonia, Adult Pneumonia is an infection of the lungs. It causes swelling in the airways of the lungs. Mucus and fluid may also build up inside the airways. One type of pneumonia can happen while a person is in a hospital. A different type can happen when a person is not in a hospital (community-acquired pneumonia).  What are the causes?  This condition is caused by germs (viruses, bacteria, or fungi). Some types of germs can be passed from one person to another. This can happen when you breathe in droplets from the cough or sneeze of an infected person. What increases the risk? You are more likely to develop this condition if you:  Have a long-term (chronic) disease, such as: ? Chronic obstructive pulmonary disease (COPD). ? Asthma. ? Cystic fibrosis. ? Congestive heart failure. ? Diabetes. ? Kidney disease.  Have HIV.  Have sickle cell disease.  Have had your spleen removed.  Do not take good care of your teeth and mouth (poor dental hygiene).  Have a medical condition that increases the risk of breathing in droplets from your own mouth and  nose.  Have a weakened body defense system (immune system).  Are a smoker.  Travel to areas where the germs that cause this illness are common.  Are around certain animals or the places they live. What are the signs or symptoms?  A dry cough.  A wet (productive) cough.  Fever.  Sweating.  Chest pain. This often happens when breathing deeply or coughing.  Fast breathing or trouble breathing.  Shortness of breath.  Shaking chills.  Feeling tired (fatigue).  Muscle aches. How is this treated? Treatment for this condition depends on many things. Most adults can be treated at home. In some cases, treatment must happen in a hospital. Treatment may include:  Medicines given by mouth or through an IV tube.  Being given extra oxygen.  Respiratory therapy. In rare cases, treatment for very bad pneumonia may include:  Using a machine to help you breathe.  Having a procedure to remove fluid from around your lungs. Follow these instructions at home: Medicines  Take over-the-counter and prescription medicines only as told by your doctor. ? Only take cough medicine if you are losing sleep.  If you were prescribed an antibiotic medicine, take it as told by your doctor. Do not stop taking the antibiotic even if you start to feel better. General instructions   Sleep with your head and neck raised (elevated). You can do this by sleeping in a recliner or by putting a few pillows under your head.  Rest as needed. Get at least 8 hours of sleep each night.  Drink enough water to keep your pee (urine) pale yellow.  Eat a healthy diet that includes plenty of vegetables, fruits, whole grains, low-fat dairy products, and lean protein.  Do not use any products that contain nicotine or tobacco. These include cigarettes, e-cigarettes, and chewing tobacco. If you need help quitting, ask your doctor.  Keep all follow-up visits as told by your doctor. This is important. How is this  prevented? A shot (vaccine) can help prevent pneumonia. Shots are often suggested for:  People older than 62 years of age.  People older than 62 years of age who: ? Are having cancer treatment. ? Have long-term (chronic) lung disease. ? Have problems with their body's defense system. You may also prevent pneumonia if you take these actions:  Get  the flu (influenza) shot every year.  Go to the dentist as often as told.  Wash your hands often. If you cannot use soap and water, use hand sanitizer. Contact a doctor if:  You have a fever.  You lose sleep because your cough medicine does not help. Get help right away if:  You are short of breath and it gets worse.  You have more chest pain.  Your sickness gets worse. This is very serious if: ? You are an older adult. ? Your body's defense system is weak.  You cough up blood. Summary  Pneumonia is an infection of the lungs.  Most adults can be treated at home. Some will need treatment in a hospital.  Drink enough water to keep your pee pale yellow.  Get at least 8 hours of sleep each night. This information is not intended to replace advice given to you by your health care provider. Make sure you discuss any questions you have with your health care provider. Document Revised: 07/26/2018 Document Reviewed: 12/01/2017 Elsevier Patient Education  2020 [employer]

## 2020-02-22 NOTE — Assessment & Plan Note (Signed)
Intermittent nausea and decreased appetite due to COVID-19 infection. Continue Zofran for nausea as needed. Proteins or meal replacement shakes may be helpful to ensure that caloric intake is adequate. If symptoms persist please let us know.

## 2020-02-26 ENCOUNTER — Encounter: Payer: Self-pay | Admitting: Nurse Practitioner

## 2020-02-26 ENCOUNTER — Telehealth: Payer: Self-pay

## 2020-02-26 ENCOUNTER — Other Ambulatory Visit: Payer: Self-pay | Admitting: Nurse Practitioner

## 2020-02-26 DIAGNOSIS — U071 COVID-19: Secondary | ICD-10-CM

## 2020-02-26 DIAGNOSIS — J1282 Pneumonia due to coronavirus disease 2019: Secondary | ICD-10-CM

## 2020-02-26 MED ORDER — AMOXICILLIN-POT CLAVULANATE 875-125 MG PO TABS: 1.0000 | ORAL_TABLET | Freq: Two times a day (BID) | ORAL | 0 refills | Status: DC

## 2020-02-26 MED ORDER — AMOXICILLIN-POT CLAVULANATE ER 1000-62.5 MG PO TB12: 2.0000 | ORAL_TABLET | Freq: Two times a day (BID) | ORAL | 0 refills | Status: DC

## 2020-02-26 MED ORDER — AZITHROMYCIN 250 MG PO TABS: ORAL_TABLET | ORAL | 0 refills | Status: DC

## 2020-02-26 NOTE — Telephone Encounter (Signed)
Pharmacy called stating that they do not have Augmentin XR in stock, but they do have regular Augmentin. They are wanting to know if you want to change the Rx. If so, they are asking that it be sent in electronically.

## 2020-02-26 NOTE — Progress Notes (Signed)
Prescription for Augmentin and Azithromycin sent to [facility].   Take antibiotics with food. Consider taking over the counter probiotic or eating yogurt while on medication to help prevent stomach upset.   Plan to follow-up in about 7-10 days to make sure you are improving.   If symptoms do not improve by Friday morning, please let us know immediately.

## 2020-02-26 NOTE — Progress Notes (Signed)
Jake Pearson,   Your chest xray results took much longer than I expected, but are finally in. They do show that there is evidence of pneumonia. How are you feeling after finishing the antibiotic Dr. Linford Arnold called in for you? Are you having any more fevers/chills at night?  If you are still having symptoms, lets plan to start another antibiotic that is a little stronger. If you are feeling better, lets plan to touch base in about a week to 10 days to make sure you are doing ok.   [name]

## 2020-02-26 NOTE — Telephone Encounter (Signed)
Augmentin XR dose changed to regular dosage due to availability of medication. New script sent to pharmacy.

## 2020-02-28 ENCOUNTER — Other Ambulatory Visit: Payer: Self-pay | Admitting: Nurse Practitioner

## 2020-02-28 ENCOUNTER — Encounter: Payer: Self-pay | Admitting: Nurse Practitioner

## 2020-02-28 MED ORDER — PREDNISONE 50 MG PO TABS: 50.0000 mg | ORAL_TABLET | Freq: Every day | ORAL | 0 refills | Status: DC

## 2020-02-28 NOTE — Progress Notes (Signed)
Prednisone burst sent for ongoing chest tightness with deep breathing associated with CAP.

## 2020-03-17 ENCOUNTER — Telehealth: Payer: Self-pay | Admitting: Family Medicine

## 2020-03-17 NOTE — Telephone Encounter (Signed)
Pt walked in at 5 and left his paperwork for FMLA for Dr. Linford Arnold to complete. I will place in [facility] in-basket on her desk now

## 2020-03-20 ENCOUNTER — Ambulatory Visit (INDEPENDENT_AMBULATORY_CARE_PROVIDER_SITE_OTHER): Payer: BC Managed Care – PPO

## 2020-03-20 ENCOUNTER — Ambulatory Visit (INDEPENDENT_AMBULATORY_CARE_PROVIDER_SITE_OTHER): Payer: BC Managed Care – PPO | Admitting: Family Medicine

## 2020-03-20 ENCOUNTER — Other Ambulatory Visit: Payer: Self-pay

## 2020-03-20 ENCOUNTER — Encounter: Payer: Self-pay | Admitting: Family Medicine

## 2020-03-20 VITALS — BP 125/89 | HR 77 | Ht 68.0 in | Wt 161.0 lb

## 2020-03-20 DIAGNOSIS — R0602 Shortness of breath: Secondary | ICD-10-CM | POA: Diagnosis not present

## 2020-03-20 DIAGNOSIS — R002 Palpitations: Secondary | ICD-10-CM | POA: Diagnosis not present

## 2020-03-20 DIAGNOSIS — Z8616 Personal history of COVID-19: Secondary | ICD-10-CM

## 2020-03-20 NOTE — Progress Notes (Signed)
Established Patient Office Visit  Subjective:  Patient ID: Jake Pearson, male    DOB: 07-Oct-1955  Age: 64 y.o. MRN: [id number]  CC:  Chief Complaint  Patient presents with  . Follow-up    HPI Norvel Wenker presents for presents for SOB persistent with activity after having had COVID.  He was diagnosed with Covid at the end of October right around October 31.  He did have hypokalemia and elevated liver enzymes at that point in time.  He also experienced a little bit of chest pain.  No chest pain, but does feel winded.  After not improving he had a chest x-ray on November 5 showing peripheral predominant airspace opacities compatible with possible pneumonia.  He was treated with antibiotics.  He is just noticed that he still feels short of breath particularly with activity not at rest.  Cough is mostly resolved but occasionally will still cough when he is in the shower.  He says he gets intermittent pain in the epigastric area and in the bilateral upper abdomen and more recently in the left mid back he says it usually last anywhere from seconds to maybe a minute.  He only had one episode of epigastric pain that actually lasted about 10 minutes.  He denies any nausea vomiting or stool changes with this.  He reports that his taste is back to normal.  He also reports feeling like sometimes his heart will beat a little bit more quickly and sometimes will feel like it skipping.  It can happen at rest not necessarily with activity.  Again occurring since Covid.  [place] he had also complained of some shortness of breath back in March before having had Covid and had a normal echocardiogram at that time.  Past Medical History:  Diagnosis Date  . Enlarged prostate   . History of nephrolithiasis   . Mid back pain 10/13/2017    Past Surgical History:  Procedure Laterality Date  . ARTHROSCOPIC REPAIR ACL    . KNEE ARTHROCENTESIS    . TONSILLECTOMY  Age 7   . VASECTOMY      Family History   Problem Relation Age of Onset  . Diabetes Maternal Uncle   . Diabetes Maternal Aunt   . Heart attack Maternal Grandfather   . Heart attack Maternal Uncle     Social History   Socioeconomic History  . Marital status: Married    Spouse name: Aram Beecham  . Number of children: 2  . Years of education: Not on file  . Highest education level: Not on file  Occupational History    Employer: [employer]  Tobacco Use  . Smoking status: Never Smoker  . Smokeless tobacco: Never Used  Substance and Sexual Activity  . Alcohol use: Yes    Alcohol/week: 1.0 standard drink    Types: 1 drink(s) per week  . Drug use: No  . Sexual activity: Yes    Partners: Female  Other Topics Concern  . 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    Social Determinants of Health   Financial Resource Strain:   . Difficulty of Paying Living Expenses: Not on file  Food Insecurity:   . Worried About Programme researcher, broadcasting/film/video in the Last Year: Not on file  . Ran Out of Food in the Last Year: Not on file  Transportation Needs:   . Lack of Transportation (Medical): Not on file  .  Lack of Transportation (Non-Medical): Not on file  Physical Activity:   . Days of Exercise per Week: Not on file  . Minutes of Exercise per Session: Not on file  Stress:   . Feeling of Stress : Not on file  Social Connections:   . Frequency of Communication with Friends and Family: Not on file  . Frequency of Social Gatherings with Friends and Family: Not on file  . Attends Religious Services: Not on file  . Active Member of Clubs or Organizations: Not on file  . Attends Banker Meetings: Not on file  . Marital Status: Not on file  Intimate Partner Violence:   . Fear of Current or Ex-Partner: Not on file  . Emotionally Abused: Not on file  . Physically Abused: Not on file  . Sexually Abused: Not on file    Outpatient Medications Prior to Visit   Medication Sig Dispense Refill  . albuterol (VENTOLIN HFA) 108 (90 Base) MCG/ACT inhaler Inhale 1-2 puffs into the lungs every 4 (four) hours as needed for wheezing or shortness of breath. 6.7 g 1  . amoxicillin-clavulanate (AUGMENTIN) 875-125 MG tablet Take 1 tablet by mouth 2 (two) times daily. 20 tablet 0  . azithromycin (ZITHROMAX) 250 MG tablet Take 2 tabs (500 mg) together on the first day, then 1 tab (250 mg) daily until prescription complete. 10 tablet 0  . predniSONE (DELTASONE) 50 MG tablet Take 1 tablet (50 mg total) by mouth daily. 5 tablet 0   No facility-administered medications prior to visit.    No Known Allergies  ROS Review of Systems    Objective:    Physical Exam Constitutional:      Appearance: He is well-developed.  HENT:     Head: Normocephalic and atraumatic.  Cardiovascular:     Rate and Rhythm: Normal rate and regular rhythm.     Heart sounds: Normal heart sounds.  Pulmonary:     Effort: Pulmonary effort is normal.     Breath sounds: Normal breath sounds.  Skin:    General: Skin is warm and dry.  Neurological:     Mental Status: He is alert and oriented to person, place, and time.  Psychiatric:        Behavior: Behavior normal.     BP 125/89   Pulse 77   Ht 5\' 8"  (1.727 m)   Wt 161 lb (73 kg)   SpO2 98%   BMI 24.48 kg/m  Wt Readings from Last 3 Encounters:  03/20/20 161 lb (73 kg)  02/22/20 157 lb 8 oz (71.4 kg)  05/30/19 177 lb (80.3 kg)     Health Maintenance Due  Topic Date Due  . COVID-19 Vaccine (1) Never done    There are no preventive care reminders to display for this patient.  Lab Results  Component Value Date   TSH 0.76 05/30/2019   Lab Results  Component Value Date   WBC 6.9 05/30/2019   HGB 15.6 05/30/2019   HCT 45.8 05/30/2019   MCV 89.8 05/30/2019   PLT 181 05/30/2019   Lab Results  Component Value Date   NA 138 05/30/2019   K 4.2 05/30/2019   CO2 28 05/30/2019   GLUCOSE 96 05/30/2019   BUN 14  05/30/2019   CREATININE 1.16 05/30/2019   BILITOT 1.6 (H) 05/30/2019   ALKPHOS 52 10/25/2012   AST 23 05/30/2019   ALT 35 05/30/2019   PROT 6.8 05/30/2019   ALBUMIN 4.2 10/25/2012   CALCIUM 9.3 05/30/2019  Lab Results  Component Value Date   CHOL 167 12/07/2018   Lab Results  Component Value Date   HDL 39 (L) 12/07/2018   Lab Results  Component Value Date   LDLCALC 107 (H) 12/07/2018   Lab Results  Component Value Date   TRIG 114 12/07/2018   Lab Results  Component Value Date   CHOLHDL 4.3 12/07/2018   No results found for: HGBA1C    Assessment & Plan:   Problem List Items Addressed This Visit      Other   Personal history of COVID-19    Other Visit Diagnoses    SOB (shortness of breath)    -  Primary   Relevant Orders   EKG 12-Lead   CBC   COMPLETE METABOLIC PANEL WITH GFR   TSH   DG Chest 2 View   Palpitations       Relevant Orders   EKG 12-Lead   CBC   COMPLETE METABOLIC PANEL WITH GFR   TSH     Shortness of breath - 6-minute walk test passed with flying colors.  Chest exam is normal.  Recommend repeat chest x-ray to make sure that pneumonia has cleared off of chest x-ray if any concerning findings can move forward with CT for further work-up.  Palpitations - EKG today shows rate of 65 bpm, normal sinus rhythm with a PVC.  Check labs for thyroid abnormality and electrolyte disturbance.  Will call with results once available.  Sitter further work-up if needed with Zio patch and heart monitor.  Consider echocardiogram to rule out other things such as pericarditis though he is not really having chest pain, though he is having some epigastric discomfort.  No orders of the defined types were placed in this encounter.   Follow-up: Return in about 4 weeks (around 04/17/2020) for shortness of breath .    Nani Gasser, MD

## 2020-03-20 NOTE — Progress Notes (Signed)
He reports that he experiences some SOB when walking short distances, taking deep breathes, or if he is carrying heavier objects.

## 2020-03-21 LAB — CBC
HCT: 39.3 % (ref 38.5–50.0)
Hemoglobin: 13.6 g/dL (ref 13.2–17.1)
MCH: 31.3 pg (ref 27.0–33.0)
MCHC: 34.6 g/dL (ref 32.0–36.0)
MCV: 90.3 fL (ref 80.0–100.0)
MPV: 10.4 fL (ref 7.5–12.5)
Platelets: 175 10*3/uL (ref 140–400)
RBC: 4.35 10*6/uL (ref 4.20–5.80)
RDW: 12.7 % (ref 11.0–15.0)
WBC: 6.7 10*3/uL (ref 3.8–10.8)

## 2020-03-21 LAB — COMPLETE METABOLIC PANEL WITH GFR
AG Ratio: 1.5 (calc) (ref 1.0–2.5)
ALT: 31 U/L (ref 9–46)
AST: 17 U/L (ref 10–35)
Albumin: 3.9 g/dL (ref 3.6–5.1)
Alkaline phosphatase (APISO): 51 U/L (ref 35–144)
BUN: 10 mg/dL (ref 7–25)
CO2: 24 mmol/L (ref 20–32)
Calcium: 9 mg/dL (ref 8.6–10.3)
Chloride: 103 mmol/L (ref 98–110)
Creat: 0.89 mg/dL (ref 0.70–1.25)
GFR, Est African American: 106 mL/min/{1.73_m2} (ref >=60)
GFR, Est Non African American: 92 mL/min/{1.73_m2} (ref >=60)
Globulin: 2.6 g/dL (calc) (ref 1.9–3.7)
Glucose, Bld: 76 mg/dL (ref 65–99)
Potassium: 4.1 mmol/L (ref 3.5–5.3)
Sodium: 140 mmol/L (ref 135–146)
Total Bilirubin: 1.3 mg/dL — ABNORMAL HIGH (ref 0.2–1.2)
Total Protein: 6.5 g/dL (ref 6.1–8.1)

## 2020-03-21 LAB — SPECIMEN COMPROMISED

## 2020-03-21 LAB — TSH: TSH: 0.84 mIU/L (ref 0.40–4.50)

## 2020-03-21 NOTE — Telephone Encounter (Signed)
Placed on Dr. Shelah Lewandowsky desk in her office.

## 2020-03-21 NOTE — Telephone Encounter (Signed)
Hi [name], do you do have his paperwork still?  I Jake Pearson try to get to today if possible.

## 2020-03-27 ENCOUNTER — Telehealth: Payer: Self-pay | Admitting: *Deleted

## 2020-03-27 ENCOUNTER — Telehealth: Payer: Self-pay

## 2020-03-27 DIAGNOSIS — R002 Palpitations: Secondary | ICD-10-CM

## 2020-03-27 DIAGNOSIS — Z8616 Personal history of COVID-19: Secondary | ICD-10-CM

## 2020-03-27 DIAGNOSIS — R0602 Shortness of breath: Secondary | ICD-10-CM

## 2020-03-27 NOTE — Telephone Encounter (Signed)
Form completed, copy made.   Called pt and LVM informing him that he can come by to pick this up and that there is a $29 charge for this.

## 2020-03-27 NOTE — Telephone Encounter (Signed)
Sahith states he would like to proceed with CT, per result note.

## 2020-03-28 NOTE — Telephone Encounter (Signed)
Forms are in the wall organizer up front waiting to be picked up

## 2020-03-28 NOTE — Telephone Encounter (Signed)
Ct order placed

## 2020-03-29 ENCOUNTER — Other Ambulatory Visit: Payer: Self-pay

## 2020-03-29 ENCOUNTER — Ambulatory Visit (INDEPENDENT_AMBULATORY_CARE_PROVIDER_SITE_OTHER): Payer: BC Managed Care – PPO

## 2020-03-29 DIAGNOSIS — R0602 Shortness of breath: Secondary | ICD-10-CM | POA: Diagnosis not present

## 2020-03-29 DIAGNOSIS — R002 Palpitations: Secondary | ICD-10-CM

## 2020-03-29 DIAGNOSIS — U099 Post covid-19 condition, unspecified: Secondary | ICD-10-CM | POA: Diagnosis not present

## 2020-03-29 DIAGNOSIS — Z8616 Personal history of COVID-19: Secondary | ICD-10-CM

## 2020-04-01 ENCOUNTER — Telehealth: Payer: Self-pay | Admitting: *Deleted

## 2020-04-01 NOTE — Telephone Encounter (Signed)
Please call pt and get him scheduled for a spirometry. Thanks!

## 2020-04-03 NOTE — Telephone Encounter (Signed)
Lvm to schedule appointment

## 2020-04-07 ENCOUNTER — Telehealth (INDEPENDENT_AMBULATORY_CARE_PROVIDER_SITE_OTHER): Payer: BC Managed Care – PPO | Admitting: Family Medicine

## 2020-04-07 VITALS — Temp 97.7°F

## 2020-04-07 DIAGNOSIS — J029 Acute pharyngitis, unspecified: Secondary | ICD-10-CM

## 2020-04-07 DIAGNOSIS — R509 Fever, unspecified: Secondary | ICD-10-CM

## 2020-04-07 LAB — POCT INFLUENZA A/B
Influenza A, POC: NEGATIVE
Influenza B, POC: NEGATIVE

## 2020-04-07 NOTE — Progress Notes (Signed)
Virtual Visit via Telephone Note  I connected with Jake Pearson on 04/07/20 at  2:00 PM EST by telephone and verified that I am speaking with the correct person using two identifiers.   I discussed the limitations, risks, security and privacy concerns of performing an evaluation and management service by telephone and the availability of in person appointments. I also discussed with the patient that there may be a patient responsible charge related to this service. The patient expressed understanding and agreed to proceed.  Patient location: at home Provider loccation: In office   Subjective:    CC: eFver  HPI: Sat night started with ST and Headache, yesterday evening starting starting getting chills and low grade fever.  Fever to 100.2 last night. No runny nose, post nasal drip.  No cough.  Feels like tongue is coated with slime. Took some Excedrin for the Headache.   He doesn't believe that he has been around any sick contacts and he also took an at home test for covid which was negative. + nausea.  Home COVID test negative this AM.      Past medical history, Surgical history, Family history not pertinant except as noted below, Social history, Allergies, and medications have been entered into the medical record, reviewed, and corrections made.   Review of Systems: No fevers, chills, night sweats, weight loss, chest pain, or shortness of breath.   Objective:    General: Speaking clearly in complete sentences without any shortness of breath.  Alert and oriented x3.  Normal judgment. No apparent acute distress.    Impression and Recommendations:    Sore throat and fever with runny nose-less likely to be strep especially with nasal symptoms but certainly possible.  More importantly I would like to test him for flu as he would be within 48-hour window to be treated with Tamiflu.  If negative then most likely viral recommend he give it a week and see if he improves.  If fever persist or  he suddenly become short of breath or develops a cough then please let us know.  He is just more recently recovering from Covid it took him well over a month to really bounce back from that.  Home test for Covid was negative consider repeating in a couple of days if not improving.      I discussed the assessment and treatment plan with the patient. The patient was provided an opportunity to ask questions and all were answered. The patient agreed with the plan and demonstrated an understanding of the instructions.   The patient was advised to call back or seek an in-person evaluation if the symptoms worsen or if the condition fails to improve as anticipated.  I provided 20 minutes of non-face-to-face time during this encounter.   Nani Gasser, MD

## 2020-04-07 NOTE — Progress Notes (Signed)
Checked temp a few hours ago it was 99.4 he took some excedrine.  He stated that he has a slight headache, sore throat general body aches, and some nausea.   He doesn't believe that he has been around any sick contacts and he also took an at home test for covid which was negative.

## 2020-04-21 ENCOUNTER — Encounter: Payer: Self-pay | Admitting: Nurse Practitioner

## 2020-04-21 ENCOUNTER — Ambulatory Visit (INDEPENDENT_AMBULATORY_CARE_PROVIDER_SITE_OTHER): Payer: Self-pay | Admitting: Nurse Practitioner

## 2020-04-21 ENCOUNTER — Other Ambulatory Visit: Payer: Self-pay

## 2020-04-21 VITALS — BP 116/78 | HR 67 | Temp 98.0°F | Wt 162.9 lb

## 2020-04-21 DIAGNOSIS — R1012 Left upper quadrant pain: Secondary | ICD-10-CM

## 2020-04-21 DIAGNOSIS — R8290 Unspecified abnormal findings in urine: Secondary | ICD-10-CM

## 2020-04-21 DIAGNOSIS — R110 Nausea: Secondary | ICD-10-CM

## 2020-04-21 DIAGNOSIS — N3090 Cystitis, unspecified without hematuria: Secondary | ICD-10-CM

## 2020-04-21 LAB — POCT URINALYSIS DIP (CLINITEK)
Bilirubin, UA: NEGATIVE
Glucose, UA: NEGATIVE mg/dL
Nitrite, UA: POSITIVE — AB
POC PROTEIN,UA: 30 — AB
Spec Grav, UA: 1.02 (ref 1.010–1.025)
Urobilinogen, UA: 0.2 E.U./dL
pH, UA: 6.5 (ref 5.0–8.0)

## 2020-04-21 MED ORDER — SULFAMETHOXAZOLE-TRIMETHOPRIM 800-160 MG PO TABS
1.0000 | ORAL_TABLET | Freq: Two times a day (BID) | ORAL | 0 refills | Status: DC
Start: 2020-04-21 — End: 2020-05-27

## 2020-04-21 MED ORDER — TAMSULOSIN HCL 0.4 MG PO CAPS: 0.4000 mg | ORAL_CAPSULE | Freq: Every day | ORAL | 0 refills | Status: DC

## 2020-04-21 NOTE — Progress Notes (Signed)
Acute Office Visit  Subjective:    Patient ID: Jake Pearson, male    DOB: July 08, 1955, 65 y.o.   MRN: [id number]  Chief Complaint  Patient presents with  . Urinary Tract Infection    HPI Patient is in today for left side pain that started intermittently about a couple of weeks. He tells me that he first noticed he had to urinate more frequently and that his urine was malodorous. He states that the symptoms have continued to worsen and he noted that in the last three days they have gotten significantly worse.   He endorses fullness in his left side with intermittent pain, foul smell urine, cloudy urine, increased urination with small volumes, chills, intermittent nausea, and suspected fever.   He denies dysuria, blood in the urine, sharp pains that do not subside, pain that causes him to double over.   He has had a kidney stone in the past and tells me that the pain is not nearly as bad as this.   Past Medical History:  Diagnosis Date  . Enlarged prostate   . History of nephrolithiasis   . Mid back pain 10/13/2017    Past Surgical History:  Procedure Laterality Date  . ARTHROSCOPIC REPAIR ACL    . KNEE ARTHROCENTESIS    . TONSILLECTOMY  Age 89   . VASECTOMY      Family History  Problem Relation Age of Onset  . Diabetes Maternal Uncle   . Diabetes Maternal Aunt   . Heart attack Maternal Grandfather   . Heart attack Maternal Uncle     Social History   Socioeconomic History  . Marital status: Married    Spouse name: Aram Beecham  . Number of children: 2  . Years of education: Not on file  . Highest education level: Not on file  Occupational History    Employer: [employer]  Tobacco Use  . Smoking status: Never Smoker  . Smokeless tobacco: Never Used  Substance and Sexual Activity  . Alcohol use: Yes    Alcohol/week: 1.0 standard drink    Types: 1 drink(s) per week  . Drug use: No  . Sexual activity: Yes    Partners: Female  Other Topics Concern  . 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    Social Determinants of Health   Financial Resource Strain: Not on file  Food Insecurity: Not on file  Transportation Needs: Not on file  Physical Activity: Not on file  Stress: Not on file  Social Connections: Not on file  Intimate Partner Violence: Not on file    Outpatient Medications Prior to Visit  Medication Sig Dispense Refill  . albuterol (VENTOLIN HFA) 108 (90 Base) MCG/ACT inhaler Inhale 1-2 puffs into the lungs every 4 (four) hours as needed for wheezing or shortness of breath. 6.7 g 1   No facility-administered medications prior to visit.    No Known Allergies  Review of Systems All review of systems negative except what is listed in the HPI     Objective:    Physical Exam Vitals and nursing note reviewed.  Constitutional:      General: He is not in acute distress.    Appearance: Normal appearance. He is normal weight. He is not ill-appearing.  HENT:     Head: Normocephalic.  Cardiovascular:     Rate and Rhythm: Normal rate and regular rhythm.     Pulses:  Normal pulses.     Heart sounds: Normal heart sounds.  Abdominal:     General: Abdomen is flat. Bowel sounds are normal. There is no distension.     Palpations: Abdomen is soft.     Tenderness: There is abdominal tenderness in the left upper quadrant. There is no right CVA tenderness, left CVA tenderness, guarding or rebound.     Comments: Feeling of "fullness" with palpation.   Musculoskeletal:        General: Normal range of motion.     Right lower leg: No edema.     Left lower leg: No edema.  Skin:    General: Skin is warm and dry.     Capillary Refill: Capillary refill takes less than 2 seconds.  Neurological:     General: No focal deficit present.     Mental Status: He is alert and oriented to person, place, and time.  Psychiatric:        Mood and Affect: Mood normal.        Behavior:  Behavior normal.        Thought Content: Thought content normal.        Judgment: Judgment normal.     BP 116/78 (BP Location: Left Arm, Patient Position: Sitting, Cuff Size: Small)   Pulse 67   Temp 98 F (36.7 C)   Wt 162 lb 14.4 oz (73.9 kg)   SpO2 98%   BMI 24.77 kg/m  Wt Readings from Last 3 Encounters:  04/21/20 162 lb 14.4 oz (73.9 kg)  03/20/20 161 lb (73 kg)  02/22/20 157 lb 8 oz (71.4 kg)    There are no preventive care reminders to display for this patient.  There are no preventive care reminders to display for this patient.   Lab Results  Component Value Date   TSH 0.84 03/20/2020   Lab Results  Component Value Date   WBC 6.7 03/20/2020   HGB 13.6 03/20/2020   HCT 39.3 03/20/2020   MCV 90.3 03/20/2020   PLT 175 03/20/2020   Lab Results  Component Value Date   NA 140 03/20/2020   K 4.1 03/20/2020   CO2 24 03/20/2020   GLUCOSE 76 03/20/2020   BUN 10 03/20/2020   CREATININE 0.89 03/20/2020   BILITOT 1.3 (H) 03/20/2020   ALKPHOS 52 10/25/2012   AST 17 03/20/2020   ALT 31 03/20/2020   PROT 6.5 03/20/2020   ALBUMIN 4.2 10/25/2012   CALCIUM 9.0 03/20/2020   Lab Results  Component Value Date   CHOL 167 12/07/2018   Lab Results  Component Value Date   HDL 39 (L) 12/07/2018   Lab Results  Component Value Date   LDLCALC 107 (H) 12/07/2018   Lab Results  Component Value Date   TRIG 114 12/07/2018   Lab Results  Component Value Date   CHOLHDL 4.3 12/07/2018   No results found for: HGBA1C     Assessment & Plan:   1. Cystitis 2. Foul smelling urine 3. Nausea 4. LUQ abdominal pain 5. Abnormal urinalysis POC UA reveals positive nitrites, leukocytes, and the presence of blood with turbid, cloudy, malodorous urine. Will send for culture for further examination.  Based on symptoms and presentation, will treat today with bactrim for 5 days.  Discussed that LUQ/L flank pain could potentially be a sign of nephrolithiasis vs infection into the  kidney area. With no costovertebral angle pain, this is less likely. Patient would like to see how antibiotics help symptoms before advancing to  a stone CT for evaluation. I agree with this plan.  Will start tamsulosin in the event stone is present and for urinary hesitancy.  Patient instructed to contact the office if he develops worsening symptoms, pain increases, or new symptoms develop.   - sulfamethoxazole-trimethoprim (BACTRIM DS) 800-160 MG tablet; Take 1 tablet by mouth 2 (two) times daily.  Dispense: 10 tablet; Refill: 0 - tamsulosin (FLOMAX) 0.4 MG CAPS capsule; Take 1 capsule (0.4 mg total) by mouth daily. 30 minutes after meal  Dispense: 10 capsule; Refill: 0 - POCT URINALYSIS DIP (CLINITEK) - Urine Culture  Return if symptoms worsen or fail to improve.    Tollie Eth, NP

## 2020-04-21 NOTE — Patient Instructions (Signed)

## 2020-04-23 LAB — URINE CULTURE
MICRO NUMBER:: 11378221
SPECIMEN QUALITY:: ADEQUATE

## 2020-04-24 NOTE — Progress Notes (Signed)
UTI positive for e. Coli. Antibiotic prescribed is showing to be effective against this bacteria. Please let us know if symptoms persist after completing the antibiotic.

## 2020-04-29 ENCOUNTER — Other Ambulatory Visit: Payer: Self-pay

## 2020-05-06 ENCOUNTER — Ambulatory Visit: Payer: Self-pay | Admitting: Family Medicine

## 2020-05-27 ENCOUNTER — Ambulatory Visit (INDEPENDENT_AMBULATORY_CARE_PROVIDER_SITE_OTHER): Payer: Self-pay | Admitting: Family Medicine

## 2020-05-27 ENCOUNTER — Other Ambulatory Visit: Payer: Self-pay

## 2020-05-27 VITALS — BP 120/71 | HR 67

## 2020-05-27 DIAGNOSIS — M542 Cervicalgia: Secondary | ICD-10-CM

## 2020-05-27 DIAGNOSIS — R0602 Shortness of breath: Secondary | ICD-10-CM

## 2020-05-27 MED ORDER — ALBUTEROL SULFATE HFA 108 (90 BASE) MCG/ACT IN AERS
4.0000 | INHALATION_SPRAY | Freq: Once | RESPIRATORY_TRACT | Status: AC
  Administered 2020-05-27: 4 via RESPIRATORY_TRACT

## 2020-05-27 NOTE — Patient Instructions (Signed)
Start a trial of a proton pump inhibitor such as omeprazole or Nexium for the next 3 to 4 weeks to see if you are noticing improvement in the shortness of breath.  If it does help then continue the medication for a full 4 to 6 weeks and then taper off slowly over several weeks.  If it is not improving or you are noticing an increase in your symptoms then let us know and we can always do further cardiac work-up if needed.  Try the exercises for your neck.  Also use heat and gentle stretches.  If not improving over the next 2 weeks then please let us know.  We can always refer you to formal physical therapy if needed.

## 2020-05-27 NOTE — Progress Notes (Signed)
Established Patient Office Visit  Subjective:  Patient ID: Jake Pearson, male    DOB: 02/24/1956  Age: 65 y.o. MRN: [id number]  CC: No chief complaint on file.   HPI Kaylum Shrum presents for follow-up shortness of breath he had complained of some intermittent shortness of breath that have been persistent with activity after having had COVID.  He says it is better than it used to be but he still notices it occasionally.  He also still has occasional palpitations not associated with the shortness of breath maybe once a week.  He does not experience chest pain with the palpitations or the shortness of breath.  Again the shortness of breath is mostly with activity.  He does report that he has been having a lot of reflux lately and wonders if that could even be contributing to the shortness of breath.  Also reports left-sided neck pain that is been present for about 3 weeks after he picked up and moved a blanket he had sudden discomfort he says since then its been bothering him sometimes a full turn his neck it will spasm and feel very painful.  He is not been doing any specific treatments     Past Medical History:  Diagnosis Date  . Enlarged prostate   . History of nephrolithiasis   . Mid back pain 10/13/2017    Past Surgical History:  Procedure Laterality Date  . ARTHROSCOPIC REPAIR ACL    . KNEE ARTHROCENTESIS    . TONSILLECTOMY  Age 28   . VASECTOMY      Family History  Problem Relation Age of Onset  . Diabetes Maternal Uncle   . Diabetes Maternal Aunt   . Heart attack Maternal Grandfather   . Heart attack Maternal Uncle     Social History   Socioeconomic History  . Marital status: Married    Spouse name: Aram Beecham  . Number of children: 2  . Years of education: Not on file  . Highest education level: Not on file  Occupational History    Employer: [employer]  Tobacco Use  . Smoking status: Never Smoker  . Smokeless tobacco: Never Used  Substance and Sexual  Activity  . Alcohol use: Yes    Alcohol/week: 1.0 standard drink    Types: 1 drink(s) per week  . Drug use: No  . Sexual activity: Yes    Partners: Female  Other Topics Concern  . 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    Social Determinants of Health   Financial Resource Strain: Not on file  Food Insecurity: Not on file  Transportation Needs: Not on file  Physical Activity: Not on file  Stress: Not on file  Social Connections: Not on file  Intimate Partner Violence: Not on file    Outpatient Medications Prior to Visit  Medication Sig Dispense Refill  . albuterol (VENTOLIN HFA) 108 (90 Base) MCG/ACT inhaler Inhale 1-2 puffs into the lungs every 4 (four) hours as needed for wheezing or shortness of breath. 6.7 g 1  . tamsulosin (FLOMAX) 0.4 MG CAPS capsule Take 1 capsule (0.4 mg total) by mouth daily. 30 minutes after meal 10 capsule 0  . sulfamethoxazole-trimethoprim (BACTRIM DS) 800-160 MG tablet Take 1 tablet by mouth 2 (two) times daily. 10 tablet 0   No facility-administered medications prior to visit.    No Known Allergies  ROS Review of Systems  Objective:    Physical Exam Constitutional:      Appearance: He is well-developed and well-nourished.  HENT:     Head: Normocephalic and atraumatic.  Cardiovascular:     Rate and Rhythm: Normal rate and regular rhythm.     Heart sounds: Normal heart sounds.  Pulmonary:     Effort: Pulmonary effort is normal.     Breath sounds: Normal breath sounds.  Musculoskeletal:     Comments: Normal flexion.  Symmetric rotation to the right and left.  He had pain with side bending on the left side of his neck when he flexed it to the right.  No pain with flexion to the left.  Nontender of the sternocleidomastoid.  Skin:    General: Skin is warm and dry.  Neurological:     Mental Status: He is alert and oriented to person, place, and time.   Psychiatric:        Mood and Affect: Mood and affect normal.        Behavior: Behavior normal.     BP 120/71   Pulse 67   SpO2 99%  Wt Readings from Last 3 Encounters:  04/21/20 162 lb 14.4 oz (73.9 kg)  03/20/20 161 lb (73 kg)  02/22/20 157 lb 8 oz (71.4 kg)     Health Maintenance Due  Topic Date Due  . COVID-19 Vaccine (1) Never done    There are no preventive care reminders to display for this patient.  Lab Results  Component Value Date   TSH 0.84 03/20/2020   Lab Results  Component Value Date   WBC 6.7 03/20/2020   HGB 13.6 03/20/2020   HCT 39.3 03/20/2020   MCV 90.3 03/20/2020   PLT 175 03/20/2020   Lab Results  Component Value Date   NA 140 03/20/2020   K 4.1 03/20/2020   CO2 24 03/20/2020   GLUCOSE 76 03/20/2020   BUN 10 03/20/2020   CREATININE 0.89 03/20/2020   BILITOT 1.3 (H) 03/20/2020   ALKPHOS 52 10/25/2012   AST 17 03/20/2020   ALT 31 03/20/2020   PROT 6.5 03/20/2020   ALBUMIN 4.2 10/25/2012   CALCIUM 9.0 03/20/2020   Lab Results  Component Value Date   CHOL 167 12/07/2018   Lab Results  Component Value Date   HDL 39 (L) 12/07/2018   Lab Results  Component Value Date   LDLCALC 107 (H) 12/07/2018   Lab Results  Component Value Date   TRIG 114 12/07/2018   Lab Results  Component Value Date   CHOLHDL 4.3 12/07/2018   No results found for: HGBA1C    Assessment & Plan:   Problem List Items Addressed This Visit   None   Visit Diagnoses    SOB (shortness of breath)    -  Primary   Relevant Medications   albuterol (VENTOLIN HFA) 108 (90 Base) MCG/ACT inhaler 4 puff (Completed)   Other Relevant Orders   PR EVAL OF BRONCHOSPASM   Neck pain         Shortness of breath-I do think the GERD certainly could be triggering it and making it worse I do think it might be worth doing a trial of a PPI for at least a month to see if he notices improvement.  Spirometry look fantastic.  FEV1 of 99%, FVC of 96% with a ratio of 77.  He did  have significant improvement in FEF 25-75 after albuterol.  Left-sided neck pain-is most consistent with strain of the sternocleidomastoid.Try the  exercises for your neck.  Also use heat and gentle stretches.  If not improving over the next 2 weeks then please let us know.  We can always refer you to formal physical therapy if needed.  Meds ordered this encounter  Medications  . albuterol (VENTOLIN HFA) 108 (90 Base) MCG/ACT inhaler 4 puff    Follow-up: Return if symptoms worsen or fail to improve.    Nani Gasser, MD

## 2020-08-22 IMAGING — US ULTRASOUND ABDOMEN LIMITED
1 series · 14 of 25 positions shown · non-contrast
Comparison: 08/04/2017 CT

CLINICAL DATA: Epigastric/upper back pain for 2 days. History renal
stones.

EXAM:
ULTRASOUND ABDOMEN LIMITED RIGHT UPPER QUADRANT

[Series 1: ultrasound abdomen limited · 14 of 43 slices shown]
[im 1/43]
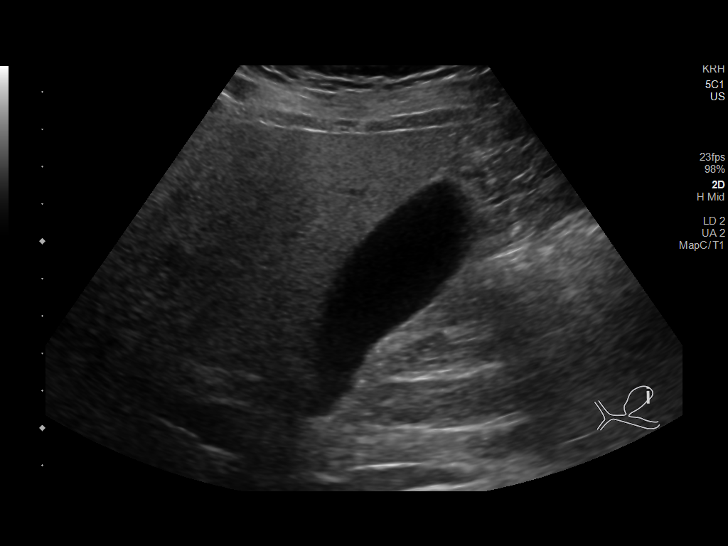
[im 4/43]
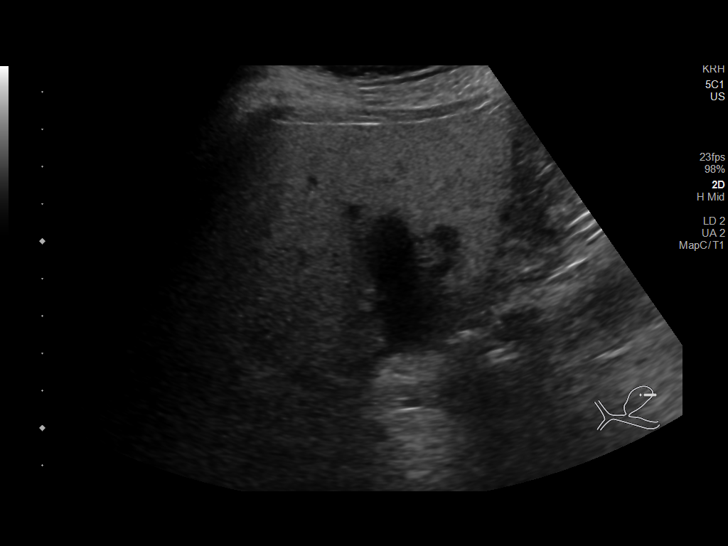
[im 8/43]
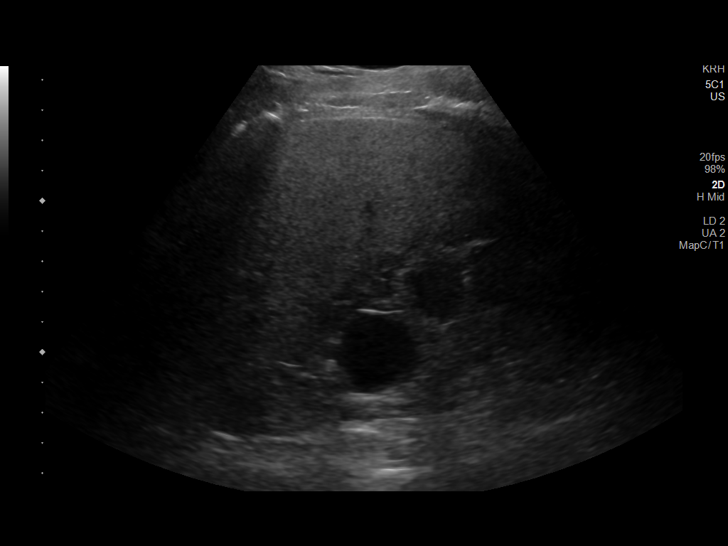
[im 11/43]
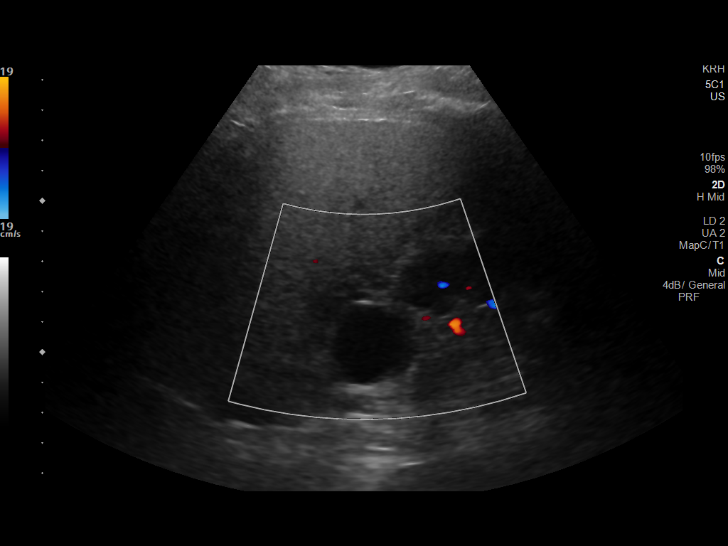
[im 15/43]
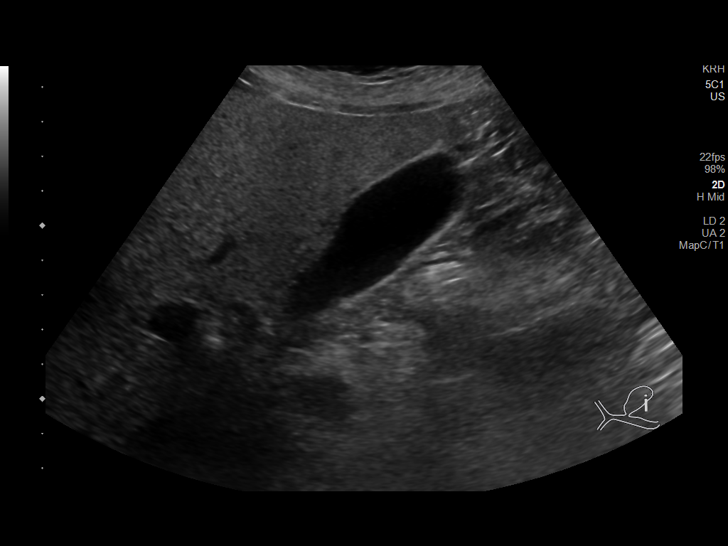
[im 16/43]
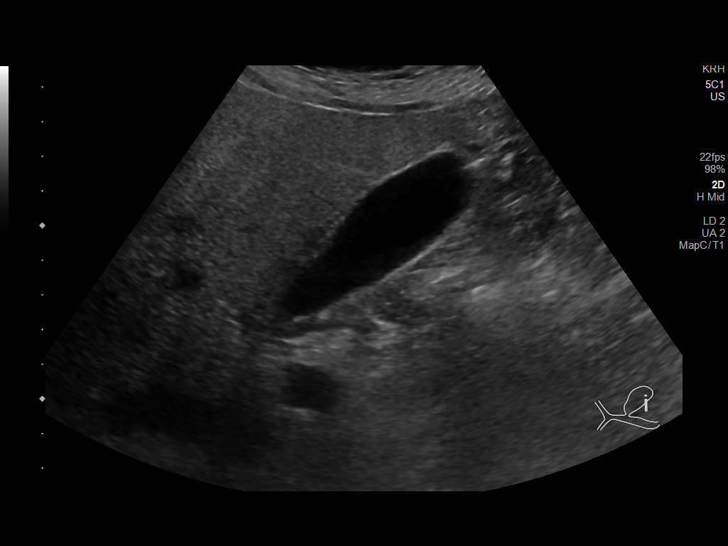
[im 20/43]
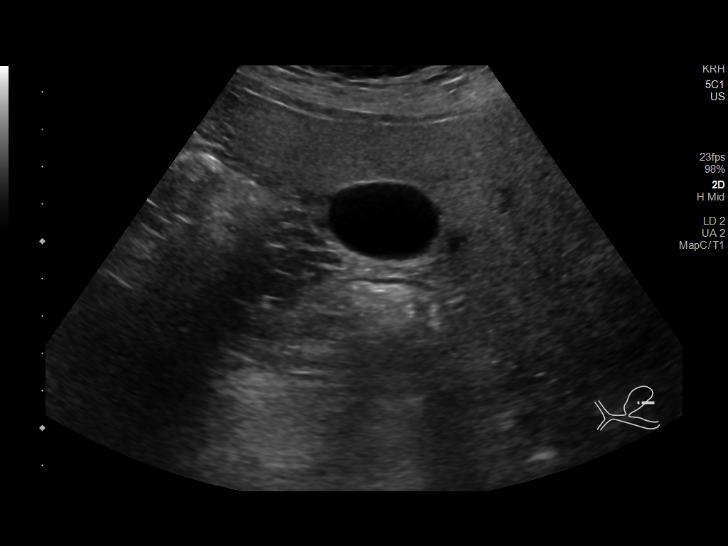
[im 23/43]
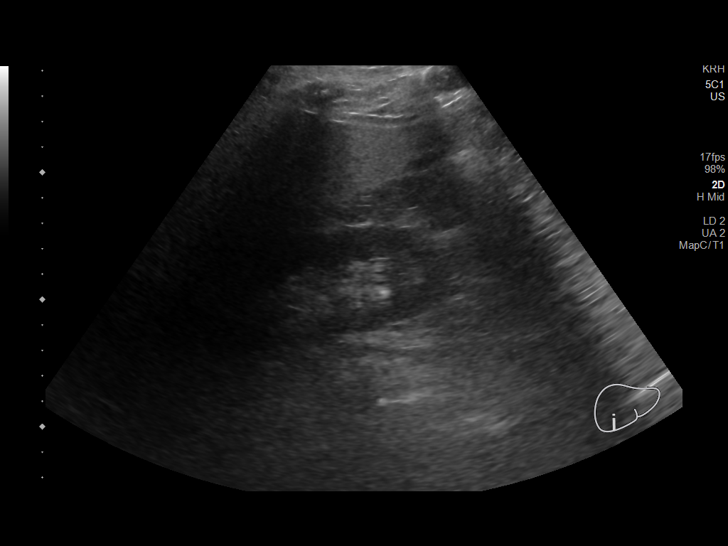
[im 27/43]
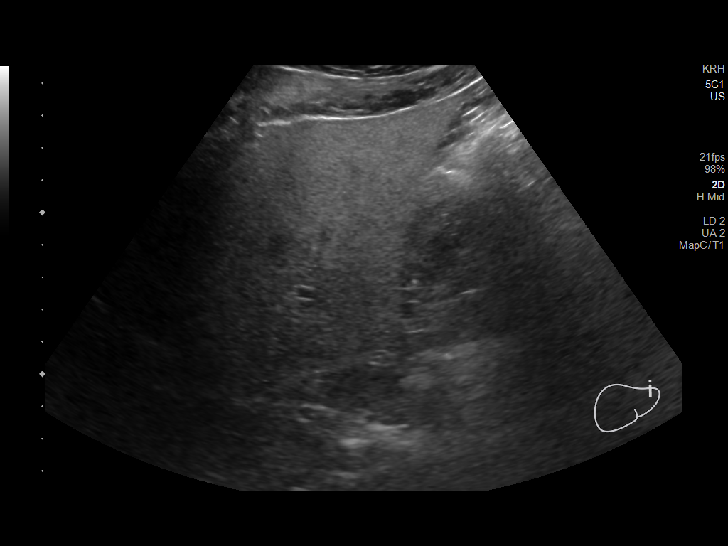
[im 29/43]
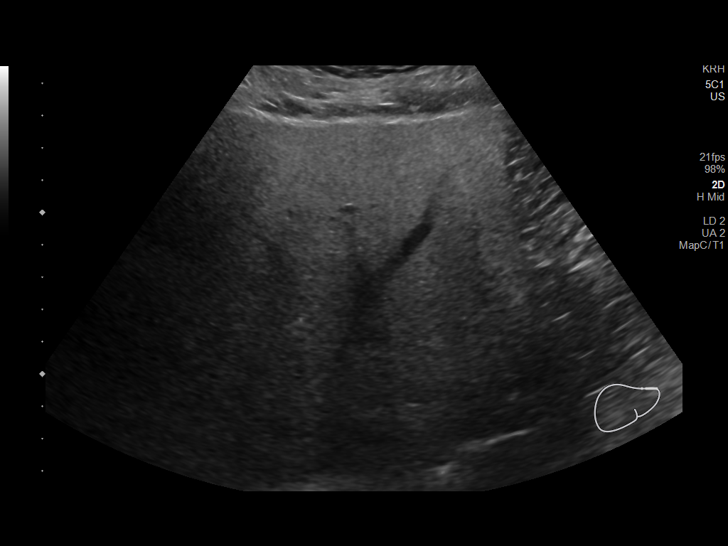
[im 32/43]
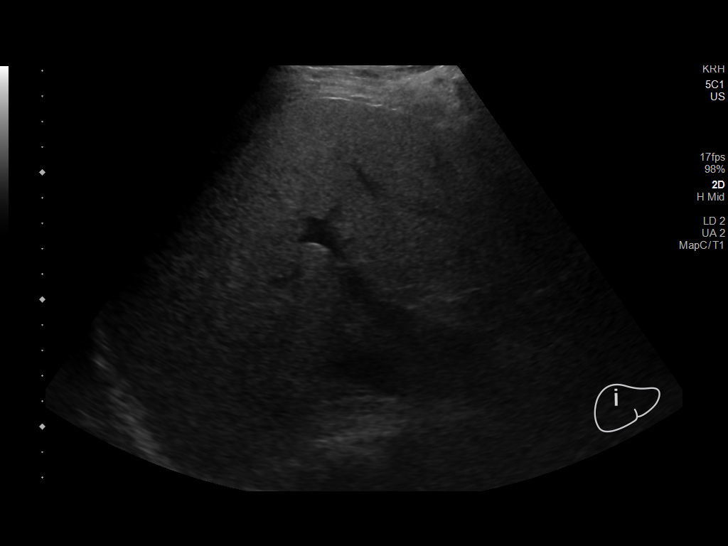
[im 36/43]
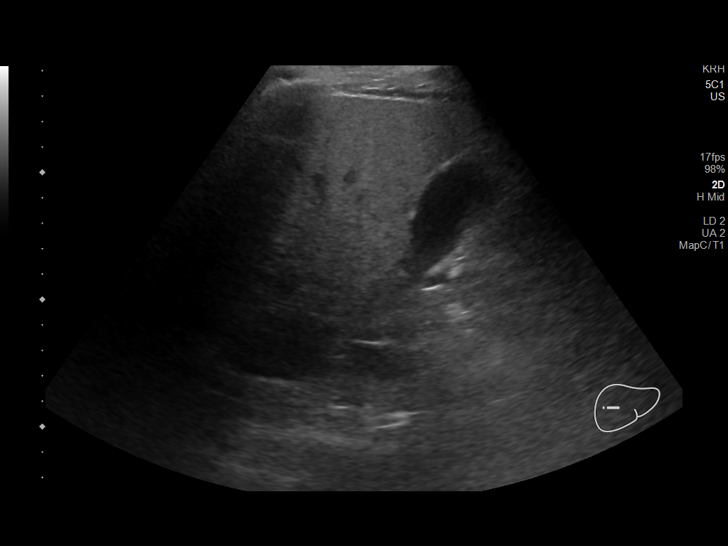
[im 39/43]
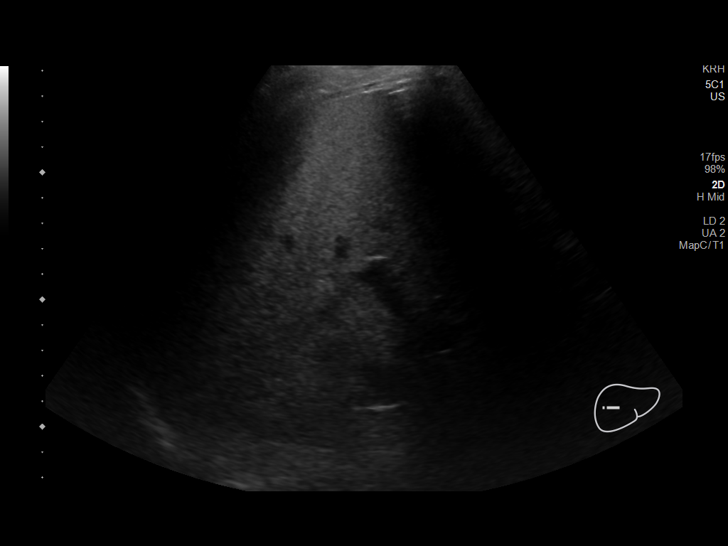
[im 43/43]
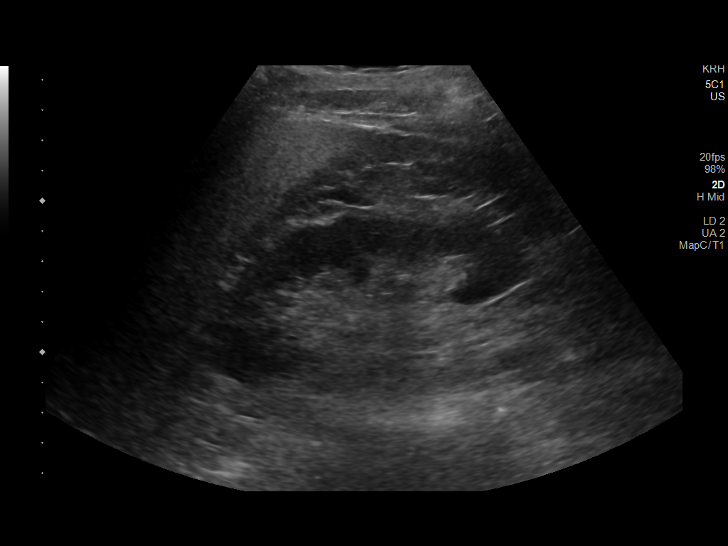

[14 of 25 positions shown; findings below may reference images not displayed]

FINDINGS: Gallbladder:

No gallstones or wall thickening visualized. No sonographic Murphy
sign noted by sonographer.

Common bile duct:

Diameter: Normal, 4 mm.

Liver:

Moderately increased echogenicity. Portal vein is patent on color
Doppler imaging with normal direction of blood flow towards the
liver.

Other: Upper pole right renal cyst of 2.6 cm.
IMPRESSION: No acute process or explanation for abdominal pain.

Upper pole right renal cyst.

Hepatic steatosis.

## 2020-08-27 IMAGING — CT CT ABDOMEN AND PELVIS WITH CONTRAST
2 of 5 series · 15 of 46 positions shown, 17 images · IV contrast (APPLIED)
Comparison: Abdominal ultrasound 11/24/2018. Abdominopelvic CT
08/04/2017.

CLINICAL DATA: Right flank and right lower abdominal pain for 1
month. History kidney stones and prostatomegaly.

EXAM:
CT ABDOMEN AND PELVIS WITH CONTRAST
TECHNIQUE: Multidetector CT imaging of the abdomen and pelvis was performed
using the standard protocol following bolus administration of
intravenous contrast.
CONTRAST:  100mL OMNIPAQUE IOHEXOL 300 MG/ML  SOLN

[Series 3: axial st · axial · 0.84mm/px · z∈[-594,-140]mm · 12 of 103 slices shown, 14 images]
[im 6/103  soft-tissue]
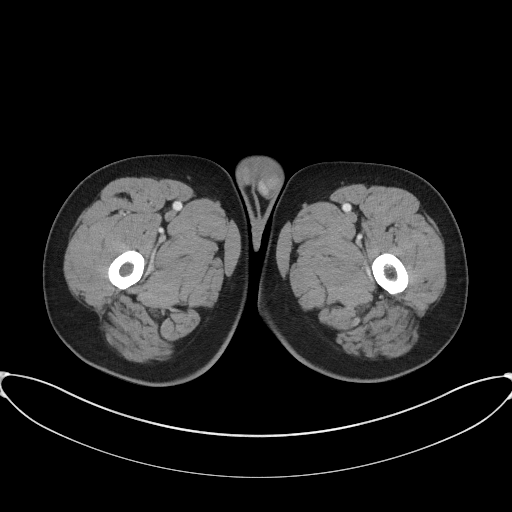
[im 6/103  bone]
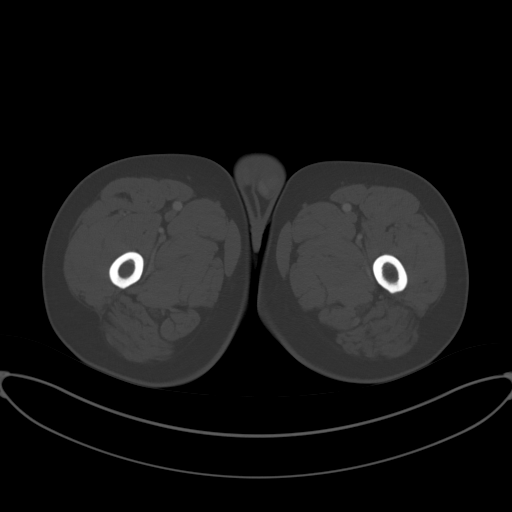
[im 18/103  soft-tissue]
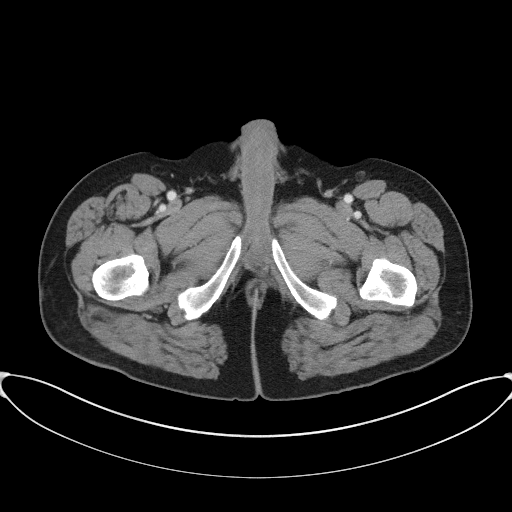
[im 23/103  soft-tissue]
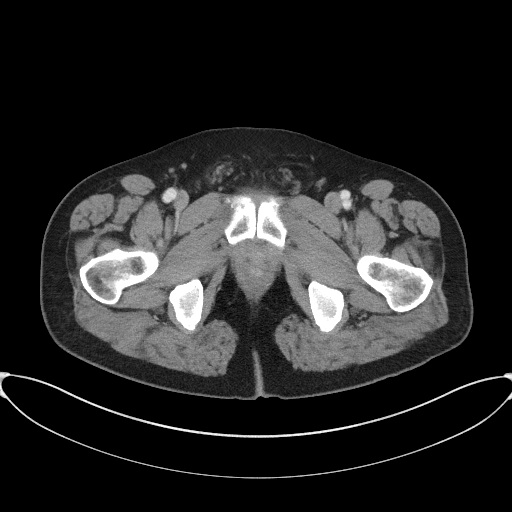
[im 29/103  soft-tissue]
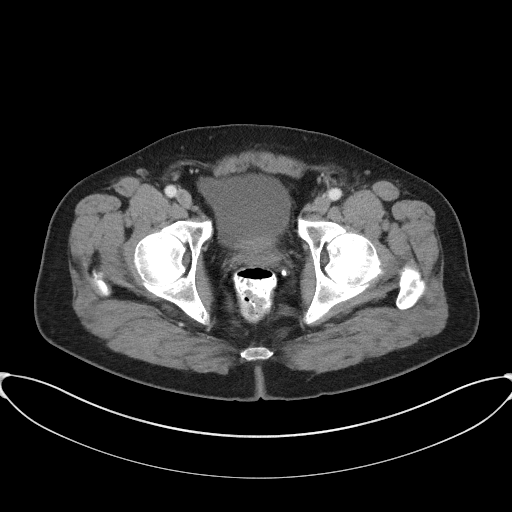
[im 40/103  soft-tissue]
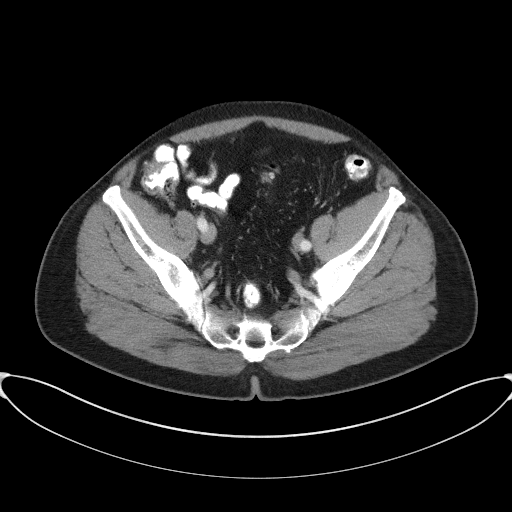
[im 46/103  soft-tissue]
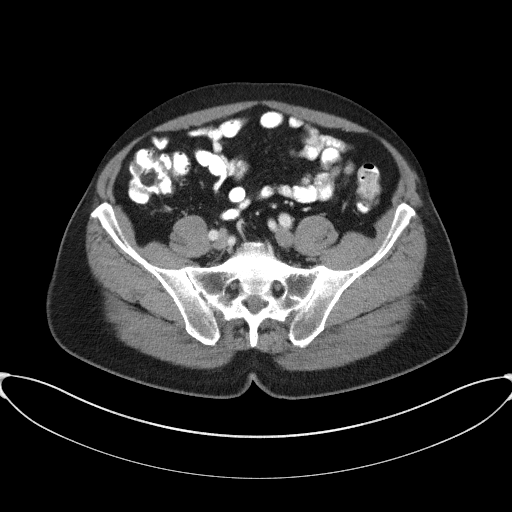
[im 57/103  soft-tissue]
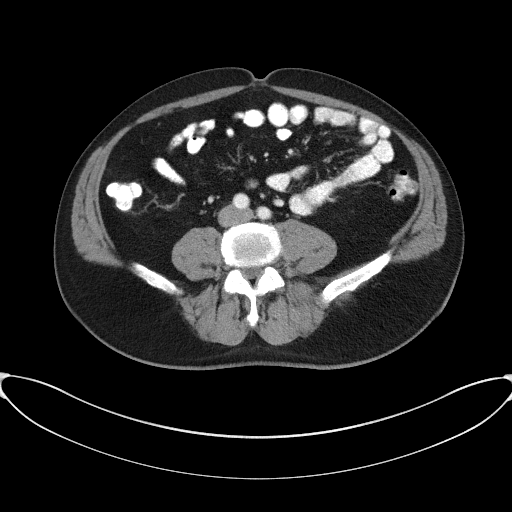
[im 63/103  soft-tissue]
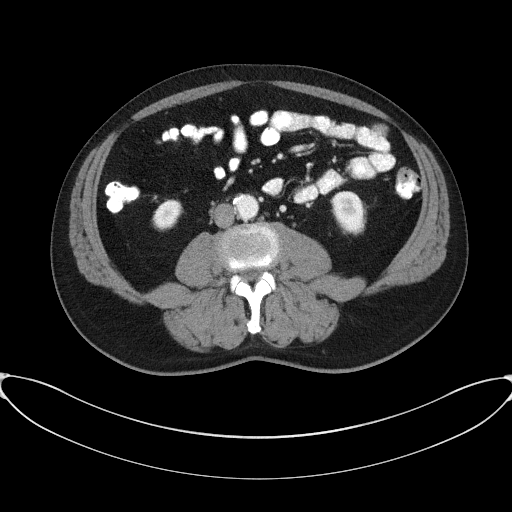
[im 74/103  soft-tissue]
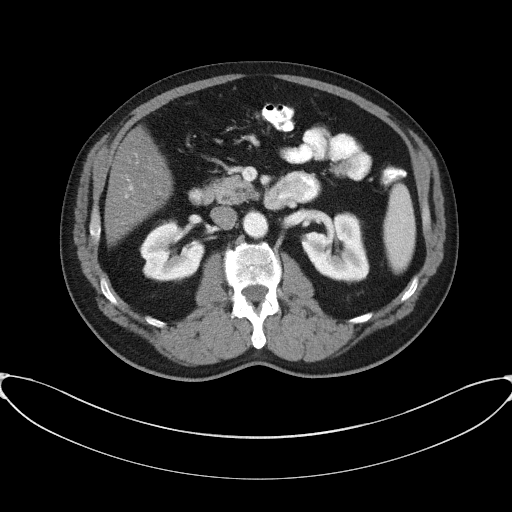
[im 74/103  bone]
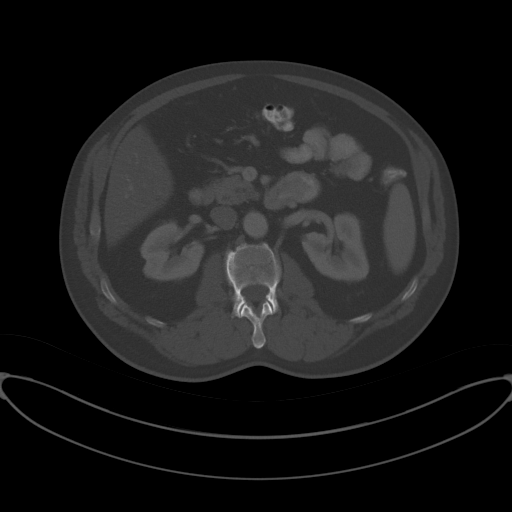
[im 80/103  soft-tissue]
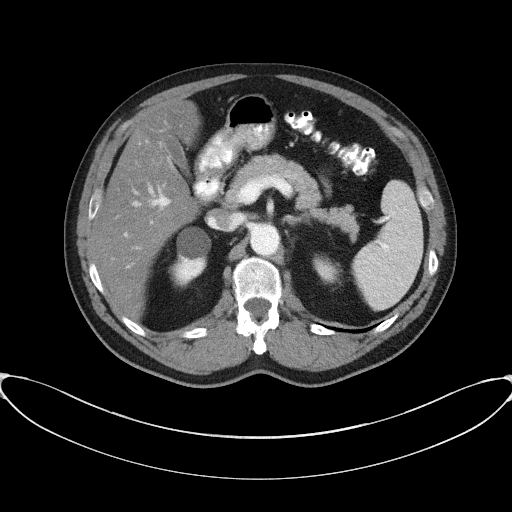
[im 86/103  soft-tissue]
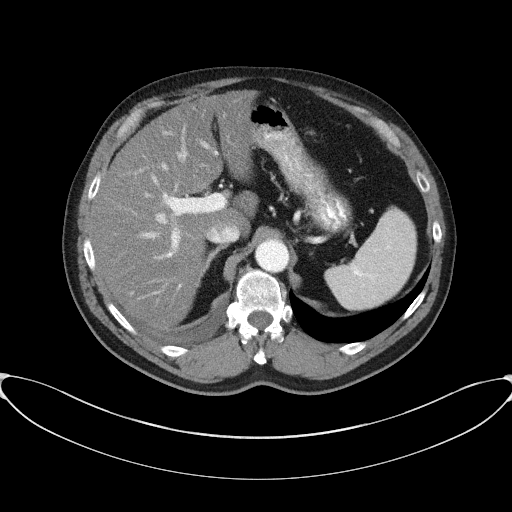
[im 97/103  soft-tissue]
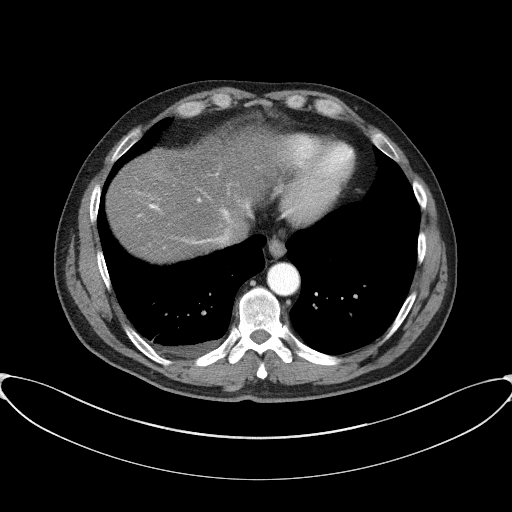

[Series 4: coronal st · coronal · 0.84mm/px · 3 of 93 slices shown]
[im 31/93  soft-tissue]
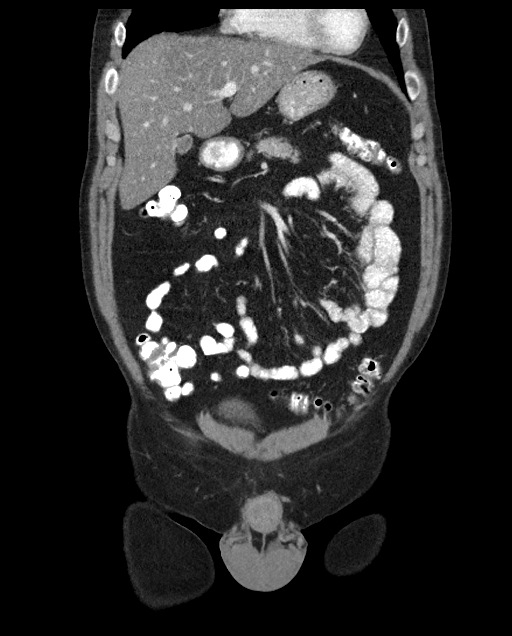
[im 41/93  soft-tissue]
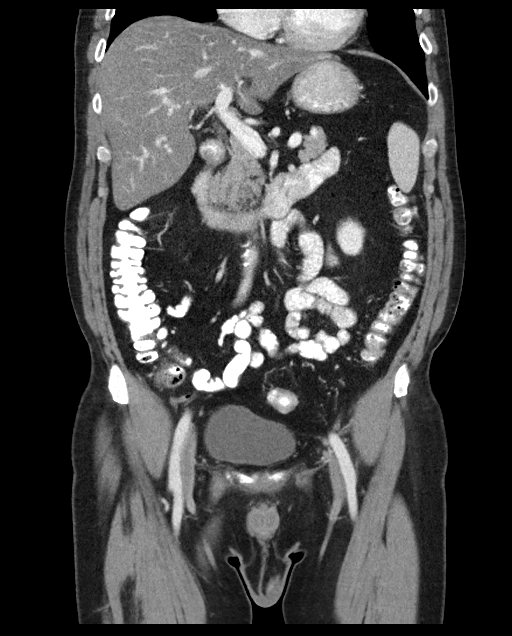
[im 52/93  soft-tissue]
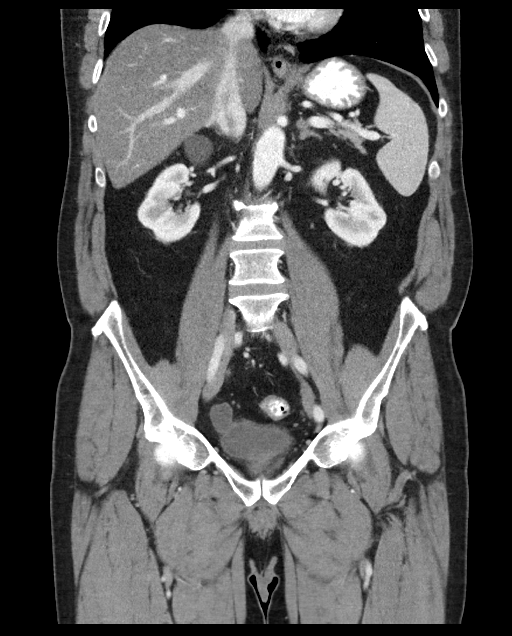

[15 of 46 positions shown; findings below may reference images not displayed]

FINDINGS: Lower chest: New small dependent right pleural effusion with
adjacent dependent right lower lobe airspace disease. The left lung
base is clear. There is no significant pericardial effusion.

Hepatobiliary: Subjective hepatic steatosis without focal
abnormality or abnormal enhancement. No evidence of gallstones,
gallbladder wall thickening or biliary dilatation.

Pancreas: Unremarkable. No pancreatic ductal dilatation or
surrounding inflammatory changes.

Spleen: Normal in size without focal abnormality.

Adrenals/Urinary Tract: Both adrenal glands appear normal. Stable
small bilateral renal cysts, largest in the upper pole of the right
kidney. There are nonobstructing calculi in the lower poles of both
kidneys. No evidence of ureteral calculus or hydronephrosis. There
is a stable Faxx Tiger bladder diverticulum on the right.

Stomach/Bowel: No evidence of bowel wall thickening, distention or
surrounding inflammatory change. The appendix appears normal. Mild
sigmoid colon diverticular changes.

Vascular/Lymphatic: There are no enlarged abdominal or pelvic lymph
nodes. Mild aortic and branch vessel atherosclerosis. No acute
vascular findings.

Reproductive: Stable mild enlargement of the prostate gland without
focal abnormality. The seminal vesicles appear normal.

Other: Stable small left inguinal hernia containing only fat. No
ascites.

Musculoskeletal: No acute or significant osseous findings. Mild
facet hypertrophy in the lumbar spine.
IMPRESSION: 1. New small right pleural effusion with patchy right lower lobe
atelectasis or early infiltrate. Correlate clinically.
2. No acute abdominal findings or explanation for the patient's
symptoms.
3. Bilateral nephrolithiasis without evidence of ureteral calculus
or hydronephrosis.
4. Stable incidental findings including hepatic steatosis, renal
cysts, mild colonic diverticulosis, a right-sided bladder
diverticulum and mild Aortic Atherosclerosis ([id number]).

## 2020-09-06 ENCOUNTER — Other Ambulatory Visit: Payer: Self-pay | Admitting: Nurse Practitioner

## 2020-09-06 DIAGNOSIS — R0789 Other chest pain: Secondary | ICD-10-CM

## 2020-11-28 ENCOUNTER — Encounter: Payer: Self-pay | Admitting: Family Medicine

## 2020-11-28 ENCOUNTER — Emergency Department
Admission: EM | Admit: 2020-11-28 | Discharge: 2020-11-28 | Disposition: A | Discharge status: Home / Self Care | Payer: BC Managed Care – PPO | Source: Home / Self Care

## 2020-11-28 ENCOUNTER — Other Ambulatory Visit: Payer: Self-pay

## 2020-11-28 ENCOUNTER — Emergency Department (INDEPENDENT_AMBULATORY_CARE_PROVIDER_SITE_OTHER): Payer: BC Managed Care – PPO

## 2020-11-28 DIAGNOSIS — M7989 Other specified soft tissue disorders: Secondary | ICD-10-CM

## 2020-11-28 DIAGNOSIS — I82412 Acute embolism and thrombosis of left femoral vein: Secondary | ICD-10-CM

## 2020-11-28 DIAGNOSIS — M79662 Pain in left lower leg: Secondary | ICD-10-CM

## 2020-11-28 MED ORDER — RIVAROXABAN (XARELTO) VTE STARTER PACK (15 & 20 MG): ORAL_TABLET | ORAL | 0 refills | Status: DC

## 2020-11-28 NOTE — ED Provider Notes (Signed)
Jake Pearson [facility]    CSN: [id number] Arrival date & time: 11/28/20  1412      History   Chief Complaint Chief Complaint  Patient presents with   Leg Pain    HPI Jake Pearson is a 65 y.o. male.   HPI 65 year old male presents with left leg thigh pain for intermittently for 1 year, complains of left calf pain and swelling which began earlier today/upon awakening, reports left heel feels numb.  Past Medical History:  Diagnosis Date   COVID 01/2020   Enlarged prostate    History of nephrolithiasis    Mid back pain 10/13/2017    Patient Active Problem List   Diagnosis Date Noted   Sensation of chest tightness 02/22/2020   Personal history of COVID-19 02/22/2020   BPH associated with nocturia 05/30/2019   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   BPPV (benign paroxysmal positional vertigo), right 12/28/2018   Aortic atherosclerosis (HCC) 12/04/2018   Kidney stones 12/04/2018   Pleural effusion, right 12/04/2018   Lumbar degenerative disc disease 01/12/2018   Snoring 10/13/2017   Bladder diverticulum 10/13/2017   Fatty liver 10/13/2017   Arthritis of right acromioclavicular joint 07/22/2017   Hearing loss of left ear 11/17/2016    Past Surgical History:  Procedure Laterality Date   ARTHROSCOPIC REPAIR ACL     KNEE ARTHROCENTESIS     TONSILLECTOMY  Age 38    VASECTOMY         Home Medications    Prior to Admission medications   Medication Sig Start Date End Date Taking? Authorizing Provider  RIVAROXABAN Carlena Hurl) VTE STARTER PACK (15 & 20 MG) Follow package directions: Take one 15mg  tablet by mouth twice a day. On day 22, switch to one 20mg  tablet once a day. Take with food. 11/28/20  Yes , FNP  albuterol (VENTOLIN HFA) 108 (90 Base) MCG/ACT inhaler Inhale 1-2 puffs into the lungs every 4 (four) hours as needed for wheezing or shortness of breath. 02/22/20   Trevor Iha, NP  tamsulosin (FLOMAX) 0.4 MG CAPS capsule  Take 1 capsule (0.4 mg total) by mouth daily. 30 minutes after meal Patient not taking: Reported on 11/28/2020 04/21/20   Early, 01/28/2021, NP    Family History Family History  Problem Relation Age of Onset   Healthy Mother    Healthy Father    Heart attack Maternal Grandfather    Diabetes Maternal Aunt    Diabetes Maternal Uncle    Heart attack Maternal Uncle     Social History Social History   Tobacco Use   Smoking status: Never   Smokeless tobacco: Never  Substance Use Topics   Alcohol use: Yes    Alcohol/week: 1.0 standard drink    Types: 1 drink(s) per week   Drug use: No     Allergies   Patient has no known allergies.   Review of Systems Review of Systems  Musculoskeletal:        Left lower leg pain and swelling x1 day  All other systems reviewed and are negative.   Physical Exam Triage Vital Signs ED Triage Vitals  Enc Vitals Group     BP      Pulse      Resp      Temp      Temp src      SpO2      Weight      Height      Head Circumference  Peak Flow      Pain Score      Pain Loc      Pain Edu?      Excl. in GC?    No data found.  Updated Vital Signs BP 127/84 (BP Location: Right Arm)   Pulse 82   Temp 99.6 F (37.6 C) (Oral)   Resp 16   Ht 5\' 8"  (1.727 m)   Wt 173 lb (78.5 kg)   SpO2 97%   BMI 26.30 kg/m    Physical Exam Vitals and nursing note reviewed.  Constitutional:      General: He is not in acute distress.    Appearance: Normal appearance. He is normal weight. He is not ill-appearing.  HENT:     Head: Normocephalic and atraumatic.     Nose: Nose normal.     Mouth/Throat:     Mouth: Mucous membranes are moist.     Pharynx: Oropharynx is clear.  Eyes:     Extraocular Movements: Extraocular movements intact.     Conjunctiva/sclera: Conjunctivae normal.     Pupils: Pupils are equal, round, and reactive to light.  Cardiovascular:     Rate and Rhythm: Normal rate and regular rhythm.     Pulses: Normal pulses.     Heart  sounds: Normal heart sounds. No murmur heard.   No friction rub. No gallop.  Pulmonary:     Effort: Pulmonary effort is normal.     Breath sounds: Normal breath sounds. No wheezing, rhonchi or rales.  Musculoskeletal:        General: Normal range of motion.     Cervical back: Normal range of motion and neck supple. No rigidity or tenderness.     Comments: Left lower leg (medial aspect of gastrocnemius): TTP, mildly erythematous, negative Homans' sign  Lymphadenopathy:     Cervical: No cervical adenopathy.  Skin:    General: Skin is warm and dry.  Neurological:     General: No focal deficit present.     Mental Status: He is alert and oriented to person, place, and time.  Psychiatric:        Mood and Affect: Mood normal.        Behavior: Behavior normal.        Thought Content: Thought content normal.     UC Treatments / Results  Labs (all labs ordered are listed, but only abnormal results are displayed) Labs Reviewed - No data to display  EKG   Radiology Venous Img Lower Unilateral Left  Result Date: 11/28/2020 CLINICAL DATA:  Left lower leg pain and swelling EXAM: LEFT LOWER EXTREMITY VENOUS DOPPLER ULTRASOUND TECHNIQUE: Gray-scale sonography with graded compression, as well as color Doppler and duplex ultrasound were performed to evaluate the lower extremity deep venous systems from the level of the common femoral vein and including the common femoral, femoral, profunda femoral, popliteal and calf veins including the posterior tibial, peroneal and gastrocnemius veins when visible. The superficial great saphenous vein was also interrogated. Spectral Doppler was utilized to evaluate flow at rest and with distal augmentation maneuvers in the common femoral, femoral and popliteal veins. COMPARISON:  None. FINDINGS: Contralateral Common Femoral Vein: Respiratory phasicity is normal and symmetric with the symptomatic side. No evidence of thrombus. Normal compressibility. Common  Femoral Vein: No evidence of thrombus. Normal compressibility, respiratory phasicity and response to augmentation. Saphenofemoral Junction: No evidence of thrombus. Normal compressibility and flow on color Doppler imaging. Profunda Femoral Vein: No evidence of thrombus. Normal compressibility and  flow on color Doppler imaging. Femoral Vein: Nonocclusive thrombus is visualized within the femoral vein, which is noncompressible Popliteal Vein: Occlusive thrombus is visualized within the popliteal vein, which is noncompressible. Calf Veins: Occlusive thrombus is visualized within the posterior tibial vein and peroneal vein, which are noncompressible. IMPRESSION: Positive for DVT involving the left femoral, popliteal, posterior tibial and peroneal veins. These results will be called to the ordering clinician or representative by the Radiologist Assistant, and communication documented in the PACS or [employer]. Electronically Signed   By: Feliberto Harts M.D.   On: 11/28/2020 16:42    Procedures Procedures (including critical care time)  Medications Ordered in UC Medications - No data to display  Initial Impression / Assessment and Plan / UC Course  I have reviewed the triage vital signs and the nursing notes.  Pertinent labs & imaging results that were available during my care of the patient were reviewed by me and considered in my medical decision making (see chart for details).     MDM: 1.  Pain and swelling of left lower leg-dedicated ultrasound to rule out DVT revealed positive for DVT involving the left femoral, popliteal, posterior tibial and peroneal veins.  Rx'd Xarelto starter pack advised patient to start medication now.  Advised patient if leg pain worsens and/or is accompanied by shortness of breath call EMS for transportation to local ED for further evaluation.  RN nursing staff set up appointment for patient with PCP on Friday, 12/05/2020 at 4 PM. Final Clinical Impressions(s) / UC  Diagnoses   Final diagnoses:  Pain and swelling of left lower leg  Acute deep vein thrombosis (DVT) of femoral vein of left lower extremity (HCC)     Discharge Instructions      Advised/instructed patient to start rivaroxaban/Xarelto starter pack now and to follow-up with PCP early next week for further evaluation.      ED Prescriptions     Medication Sig Dispense Auth. Provider   RIVAROXABAN Carlena Hurl) VTE STARTER PACK (15 & 20 MG) Follow package directions: Take one 15mg  tablet by mouth twice a day. On day 22, switch to one 20mg  tablet once a day. Take with food. 51 each , FNP      PDMP not reviewed this encounter.   , FNP 11/28/20 1709

## 2020-11-28 NOTE — ED Triage Notes (Signed)
Has had intermittent thigh pain x 1 year C/o pain and swelling to left calf today  Left heel feels  numb  Denies any injury - Left ACL repair 1982 No OTC meds COVID 10/21 No COVID vaccine

## 2020-11-28 NOTE — Discharge Instructions (Addendum)
Advised/instructed patient to start Rivaroxaban/Xarelto starter pack now and to follow-up with PCP early next week for further evaluation.  RN nursing staff scheduled appointment for patient with his PCP for next Friday, 12/05/2020 at 4 PM.  Patient has been advised to present to appointment 15 minutes earlier or at 3:45 PM on Friday, 12/05/2020.

## 2020-12-01 ENCOUNTER — Telehealth: Payer: Self-pay | Admitting: General Practice

## 2020-12-01 NOTE — Telephone Encounter (Signed)
Transition Care Management Unsuccessful Follow-up Telephone Call  Date of discharge and from where:  11/30/20 from [facility]  Attempts:  1st Attempt  Reason for unsuccessful TCM follow-up call:  No answer/busy    

## 2020-12-03 NOTE — Telephone Encounter (Signed)
Transition Care Management Follow-up Telephone Call Date of discharge and from where: 11/30/20 from [facility] How have you been since you were released from the hospital? Leg is still sore; still in pain all over the body. Any questions or concerns? No  Items Reviewed: Did the pt receive and understand the discharge instructions provided? Yes  Medications obtained and verified? Yes  Other? No  Any new allergies since your discharge? No  Dietary orders reviewed? Yes Do you have support at home? Yes   Home Care and Equipment/Supplies: Were home health services ordered? no   Functional Questionnaire: (I = Independent and D = Dependent) ADLs: I  Bathing/Dressing- I  Meal Prep- I  Eating- I  Maintaining continence- I  Transferring/Ambulation- I  Managing Meds- I  Follow up appointments reviewed:  PCP Hospital f/u appt confirmed? Yes  Scheduled to see Dr. Linford Arnold on 12/05/20. Specialist Hospital f/u appt confirmed? No   Are transportation arrangements needed? No  If their condition worsens, is the pt aware to call PCP or go to the Emergency Dept.? Yes Was the patient provided with contact information for the PCP's office or ED? Yes Was to pt encouraged to call back with questions or concerns? Yes

## 2020-12-05 ENCOUNTER — Telehealth: Payer: Self-pay

## 2020-12-05 ENCOUNTER — Other Ambulatory Visit: Payer: Self-pay

## 2020-12-05 ENCOUNTER — Ambulatory Visit (INDEPENDENT_AMBULATORY_CARE_PROVIDER_SITE_OTHER): Payer: BC Managed Care – PPO | Admitting: Family Medicine

## 2020-12-05 DIAGNOSIS — R310 Gross hematuria: Secondary | ICD-10-CM | POA: Diagnosis not present

## 2020-12-05 DIAGNOSIS — R110 Nausea: Secondary | ICD-10-CM

## 2020-12-05 DIAGNOSIS — I2699 Other pulmonary embolism without acute cor pulmonale: Secondary | ICD-10-CM | POA: Insufficient documentation

## 2020-12-05 DIAGNOSIS — I824Y2 Acute embolism and thrombosis of unspecified deep veins of left proximal lower extremity: Secondary | ICD-10-CM | POA: Diagnosis not present

## 2020-12-05 LAB — POCT URINALYSIS DIP (CLINITEK)
Glucose, UA: NEGATIVE mg/dL
Ketones, POC UA: NEGATIVE mg/dL
Leukocytes, UA: NEGATIVE
Nitrite, UA: NEGATIVE
POC PROTEIN,UA: 100 — AB
Spec Grav, UA: >=1.03 — AB (ref 1.010–1.025)
Urobilinogen, UA: 0.2 E.U./dL
pH, UA: 5.5 (ref 5.0–8.0)

## 2020-12-05 NOTE — Assessment & Plan Note (Signed)
Continue Xarelto.  Unfortunately I think the hematuria is secondary to the blood thinner and possibly a stone.  Did encourage him to go ahead and call urology to make an appointment were happy to make referral on Monday if he needs [place] to make a formal referral.  This is his first DVT/pulmonary embolism.  We discussed doing a more thorough work-up once he comes off of the medication.  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

## 2020-12-05 NOTE — Telephone Encounter (Signed)
Transition Care Management Follow-up Telephone Call Date of discharge and from where: 12/04/2020 from [facility] How have you been since you were released from the hospital? Pt stated that he is feeling better and did not have any questions or concerns at this time.  Any questions or concerns? No  Items Reviewed: Did the pt receive and understand the discharge instructions provided? Yes  Medications obtained and verified? Yes  Other? No  Any new allergies since your discharge? No  Dietary orders reviewed? No Do you have support at home? Yes   Functional Questionnaire: (I = Independent and D = Dependent) ADLs: I  Bathing/Dressing- I  Meal Prep- I  Eating- I  Maintaining continence- I  Transferring/Ambulation- I  Managing Meds- I   Follow up appointments reviewed:  PCP Hospital f/u appt confirmed? Yes  Scheduled to see Jake Gasser, MD on 12/04/2020 @ 4:00pm. Specialist Hospital f/u appt confirmed? No   Are transportation arrangements needed? No  If their condition worsens, is the pt aware to call PCP or go to the Emergency Dept.? Yes Was the patient provided with contact information for the PCP's office or ED? Yes Was to pt encouraged to call back with questions or concerns? Yes

## 2020-12-05 NOTE — Progress Notes (Signed)
Established Patient Office Visit  Subjective:  Patient ID: Jake Pearson, male    DOB: January 08, 1956  Age: 65 y.o. MRN: [id number]  CC:  Chief Complaint  Patient presents with   Blood Clot LLL    HPI Jake Pearson presents for f/u of pulmonary embolism and DVT.  He started experiencing some left lower leg swelling and went to the emergency department on August 12.[name]  He did have a DVT involving the left femoral, popliteal and posterior tibial and peroneal veins.  He was prescribed Xarelto.  He was experiencing shortness of breath which has really honestly been a chronic issue for him.  But family encouraged him to go to the emergency department on August 14.  Because he had just been diagnosed with a DVT 2 days prior they went ahead and did a chest CT.  He was noted to have right lower pulmonary emboli with possible right heart strain.  He says while he was in the emergency department on the 15th he started noticing blood in his urine he finally went to the emergency department again 2 days ago on the 17th he says he will wake up in the morning and his urine looks clear and then as the day progresses it gets darker and darker it is more brown-colored by the end of the day he does have a history of kidney stones but has not passed anything in a couple of years.  He has not been having any significant pain.  Though he had a little bit of discomfort over on his left side.  No fevers or chills no dysuria.  Urinalysis in the emergency department did show nitrites and leukocytes he was given a prescription for cephalexin.  Says he actually has not started the antibiotic yet and wanted to have his urine rechecked today.  Past Medical History:  Diagnosis Date   COVID 01/2020   Enlarged prostate    History of nephrolithiasis    Mid back pain 10/13/2017    Past Surgical History:  Procedure Laterality Date   ARTHROSCOPIC REPAIR ACL     KNEE ARTHROCENTESIS     TONSILLECTOMY  Age 56    VASECTOMY       Family History  Problem Relation Age of Onset   Healthy Mother    Healthy Father    Heart attack Maternal Grandfather    Diabetes Maternal Aunt    Diabetes Maternal Uncle    Heart attack Maternal Uncle     Social History   Socioeconomic History   Marital status: Married    Spouse name: Aram Beecham   Number of children: 2   Years of education: Not on file   Highest education level: Not on file  Occupational History    Employer: [employer]  Tobacco Use   Smoking status: Never   Smokeless tobacco: Never  Substance and Sexual Activity   Alcohol use: Yes    Alcohol/week: 1.0 standard drink    Types: 1 drink(s) per week   Drug use: No   Sexual activity: Yes    Partners: Female  Other Topics Concern   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    Social Determinants of Health   Financial Resource Strain: Not on file  Food Insecurity: Not on file  Transportation Needs: Not on file  Physical Activity: Not on file  Stress: Not on file  Social Connections: Not on file  Intimate Partner Violence: Not on file    Outpatient Medications Prior to Visit  Medication Sig Dispense Refill   albuterol (VENTOLIN HFA) 108 (90 Base) MCG/ACT inhaler Inhale 1-2 puffs into the lungs every 4 (four) hours as needed for wheezing or shortness of breath. 8.5 g 1   RIVAROXABAN (XARELTO) VTE STARTER PACK (15 & 20 MG) Follow package directions: Take one 15mg  tablet by mouth twice a day. On day 22, switch to one 20mg  tablet once a day. Take with food. 51 each 0   tamsulosin (FLOMAX) 0.4 MG CAPS capsule Take 1 capsule (0.4 mg total) by mouth daily. 30 minutes after meal (Patient not taking: Reported on 11/28/2020) 10 capsule 0   No facility-administered medications prior to visit.    No Known Allergies  ROS Review of Systems    Objective:    Physical Exam Constitutional:      Appearance: Normal appearance. He is  well-developed.  HENT:     Head: Normocephalic and atraumatic.  Cardiovascular:     Rate and Rhythm: Normal rate and regular rhythm.     Heart sounds: Normal heart sounds.  Pulmonary:     Effort: Pulmonary effort is normal.     Breath sounds: Normal breath sounds.  Skin:    General: Skin is warm and dry.  Neurological:     Mental Status: He is alert and oriented to person, place, and time. Mental status is at baseline.  Psychiatric:        Behavior: Behavior normal.    There were no vitals taken for this visit. Wt Readings from Last 3 Encounters:  11/28/20 173 lb (78.5 kg)  04/21/20 162 lb 14.4 oz (73.9 kg)  03/20/20 161 lb (73 kg)     Health Maintenance Due  Topic Date Due   COVID-19 Vaccine (1) Never done   Zoster Vaccines- Shingrix (1 of 2) Never done   INFLUENZA VACCINE  11/17/2020    There are no preventive care reminders to display for this patient.  Lab Results  Component Value Date   TSH 0.84 03/20/2020   Lab Results  Component Value Date   WBC 6.7 03/20/2020   HGB 13.6 03/20/2020   HCT 39.3 03/20/2020   MCV 90.3 03/20/2020   PLT 175 03/20/2020   Lab Results  Component Value Date   NA 140 03/20/2020   K 4.1 03/20/2020   CO2 24 03/20/2020   GLUCOSE 76 03/20/2020   BUN 10 03/20/2020   CREATININE 0.89 03/20/2020   BILITOT 1.3 (H) 03/20/2020   ALKPHOS 52 10/25/2012   AST 17 03/20/2020   ALT 31 03/20/2020   PROT 6.5 03/20/2020   ALBUMIN 4.2 10/25/2012   CALCIUM 9.0 03/20/2020   Lab Results  Component Value Date   CHOL 167 12/07/2018   Lab Results  Component Value Date   HDL 39 (L) 12/07/2018   Lab Results  Component Value Date   LDLCALC 107 (H) 12/07/2018   Lab Results  Component Value Date   TRIG 114 12/07/2018   Lab Results  Component Value Date   CHOLHDL 4.3 12/07/2018   No results found for: HGBA1C    Assessment & Plan:   Problem List Items Addressed This Visit       Cardiovascular and Mediastinum   Pulmonary embolism  and infarction (HCC) - Primary   Relevant Orders   POCT URINALYSIS DIP (CLINITEK) (Completed)   Acute deep vein thrombosis (DVT) of proximal vein of left lower extremity (HCC)  Continue Xarelto.  Unfortunately I think the hematuria is secondary to the blood thinner and possibly a stone.  Did encourage him to go ahead and call urology to make an appointment were happy to make referral on Monday if he needs [place] to make a formal referral.  This is his first DVT/pulmonary embolism.  We discussed doing a more thorough work-up once he comes off of the medication.  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       Other Visit Diagnoses     Nausea       Gross hematuria       Relevant Orders   POCT URINALYSIS DIP (CLINITEK) (Completed)       Gross hematuria-urinalysis today negative for nitrites and leukocytes so encouraged him not to take the cephalexin since it looks like he probably does not have a UTI he does have a history of kidney stones and has seen urology in the past.  Did encourage him to give them a call recount a stuck between a rock and a hard place and that he really does need to be on the blood thinner with a new diagnosis of a DVT and pulmonary embolism.  Some very hesitant to stop it as long as he is not getting symptomatic anemia.  He knows to call or seek care if he starts feeling lightheaded or dizzy.  If he continues to have hematuria through the weekend encouraged him this to give [place] a call back on Monday we can always recheck a CBC at that time.  No orders of the defined types were placed in this encounter.   Follow-up: Return if symptoms worsen or fail to improve.      I spent 35 minutes on the day of the encounter to include pre-visit record review, face-to-face time with the patient and post visit ordering of test.  Nani Gasser, MD

## 2020-12-06 ENCOUNTER — Encounter: Payer: Self-pay | Admitting: Family Medicine

## 2020-12-08 ENCOUNTER — Encounter: Payer: Self-pay | Admitting: Family Medicine

## 2020-12-23 ENCOUNTER — Encounter: Payer: Self-pay | Admitting: Family Medicine

## 2020-12-24 MED ORDER — RIVAROXABAN 20 MG PO TABS: 20.0000 mg | ORAL_TABLET | Freq: Every day | ORAL | 1 refills | Status: DC

## 2020-12-24 MED ORDER — PANTOPRAZOLE SODIUM 40 MG PO TBEC
40.0000 mg | DELAYED_RELEASE_TABLET | Freq: Every day | ORAL | 3 refills | Status: DC
Start: 2020-12-24 — End: 2021-12-11

## 2020-12-24 NOTE — Telephone Encounter (Signed)
Don't use prilosec. Rx sent instead.   New rx also for xarelto sent.  Schedule f/u in 1 mo

## 2021-01-06 ENCOUNTER — Encounter: Payer: Self-pay | Admitting: Family Medicine

## 2021-01-16 ENCOUNTER — Encounter: Payer: Self-pay | Admitting: Family Medicine

## 2021-01-20 ENCOUNTER — Ambulatory Visit: Payer: BC Managed Care – PPO | Admitting: Family Medicine

## 2021-01-20 ENCOUNTER — Encounter: Payer: Self-pay | Admitting: Family Medicine

## 2021-01-20 ENCOUNTER — Other Ambulatory Visit: Payer: Self-pay

## 2021-01-20 VITALS — BP 120/72 | HR 71 | Ht 68.0 in | Wt 176.0 lb

## 2021-01-20 DIAGNOSIS — Z1211 Encounter for screening for malignant neoplasm of colon: Secondary | ICD-10-CM

## 2021-01-20 DIAGNOSIS — M79652 Pain in left thigh: Secondary | ICD-10-CM

## 2021-01-20 DIAGNOSIS — R17 Unspecified jaundice: Secondary | ICD-10-CM

## 2021-01-20 DIAGNOSIS — R195 Other fecal abnormalities: Secondary | ICD-10-CM

## 2021-01-20 DIAGNOSIS — M79651 Pain in right thigh: Secondary | ICD-10-CM | POA: Diagnosis not present

## 2021-01-20 DIAGNOSIS — G629 Polyneuropathy, unspecified: Secondary | ICD-10-CM | POA: Diagnosis not present

## 2021-01-20 NOTE — Progress Notes (Signed)
Established Patient Office Visit  Subjective:  Patient ID: Jake Pearson, male    DOB: 10-Mar-1956  Age: 65 y.o. MRN: [id number]  CC:  Chief Complaint  Patient presents with   Follow-up    Pt stated that he continues to experience leg pain and burning sensations in his shins. He states that as a teenager he had shin splints. He is going on a hiking trip in 2 wks    HPI Jake Pearson presents for bilateral leg pain.  August 12 he went to the emergency room for pain and swelling in his left lower leg and was diagnosed with a DVT he then went back to the ED 2 days later and was diagnosed with a pulmonary embolism as well.  Elevated bilirubin-he did follow-up with urology about the hematuria so far he has had a negative work-up with them but they did note elevated bilirubin levels in his blood and in his urine and wanted [place] to work that up further.  He also reports that for years he has had numbness in his toes now it feels like he occasionally has a clump of socks underneath his feet.  Years ago just darted in 1 toe with feeling like it was going through a hole in his sock.  But now its both feet and all of his toes.  Comes in today in particular because he has been getting a burning sensation in his left anterior shin he says it almost feels like shinsplints when he was a teenager but he has not been doing a lot of extra walking etc.  He has been standing more since school started back.  He normally wears good supportive shoes.  He also reports pain in both thighs anteriorly and laterally that feels more like a "ache" he said it just seems to happen randomly it does not seem to be associated with activity or rest.  He says the longest it lasted is about 45 minutes and then it just seems to go away on its own.  The burning in the shins also do not seem to be triggered by anything specific but often more occurs after activity.  The pain does not wake him up at night.  Past Medical History:   Diagnosis Date   COVID 01/2020   Enlarged prostate    History of nephrolithiasis    Mid back pain 10/13/2017    Past Surgical History:  Procedure Laterality Date   ARTHROSCOPIC REPAIR ACL     KNEE ARTHROCENTESIS     TONSILLECTOMY  Age 61    VASECTOMY      Family History  Problem Relation Age of Onset   Healthy Mother    Healthy Father    Heart attack Maternal Grandfather    Diabetes Maternal Aunt    Diabetes Maternal Uncle    Heart attack Maternal Uncle     Social History   Socioeconomic History   Marital status: Married    Spouse name: Aram Beecham   Number of children: 2   Years of education: Not on file   Highest education level: Not on file  Occupational History    Employer: [employer]  Tobacco Use   Smoking status: Never   Smokeless tobacco: Never  Substance and Sexual Activity   Alcohol use: Yes    Alcohol/week: 1.0 standard drink    Types: 1 drink(s) per week   Drug use: No   Sexual activity: Yes    Partners: Female  Other Topics Concern  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    Social Determinants of Health   Financial Resource Strain: Not on file  Food Insecurity: Not on file  Transportation Needs: Not on file  Physical Activity: Not on file  Stress: Not on file  Social Connections: Not on file  Intimate Partner Violence: Not on file    Outpatient Medications Prior to Visit  Medication Sig Dispense Refill   albuterol (VENTOLIN HFA) 108 (90 Base) MCG/ACT inhaler Inhale 1-2 puffs into the lungs every 4 (four) hours as needed for wheezing or shortness of breath. 8.5 g 1   pantoprazole (PROTONIX) 40 MG tablet Take 1 tablet (40 mg total) by mouth daily. 30 tablet 3   rivaroxaban (XARELTO) 20 MG TABS tablet Take 1 tablet (20 mg total) by mouth daily with supper. 90 tablet 1   No facility-administered medications prior to visit.    No Known Allergies  ROS Review of  Systems    Objective:    Physical Exam Vitals reviewed.  Constitutional:      Appearance: He is well-developed.  HENT:     Head: Normocephalic and atraumatic.  Eyes:     Conjunctiva/sclera: Conjunctivae normal.  Cardiovascular:     Rate and Rhythm: Normal rate.  Pulmonary:     Effort: Pulmonary effort is normal.  Musculoskeletal:     Comments: Patellar reflexes 2+ bilaterally strength in the hips knees and ankles is 5 out of 5.  No edema of either ankle.  Skin:    General: Skin is dry.     Coloration: Skin is not pale.  Neurological:     Mental Status: He is alert and oriented to person, place, and time.  Psychiatric:        Behavior: Behavior normal.    BP 120/72   Pulse 71   Ht 5\' 8"  (1.727 m)   Wt 176 lb (79.8 kg)   SpO2 99%   BMI 26.76 kg/m  Wt Readings from Last 3 Encounters:  01/20/21 176 lb (79.8 kg)  11/28/20 173 lb (78.5 kg)  04/21/20 162 lb 14.4 oz (73.9 kg)     There are no preventive care reminders to display for this patient.  There are no preventive care reminders to display for this patient.  Lab Results  Component Value Date   TSH 0.84 03/20/2020   Lab Results  Component Value Date   WBC 6.7 03/20/2020   HGB 13.6 03/20/2020   HCT 39.3 03/20/2020   MCV 90.3 03/20/2020   PLT 175 03/20/2020   Lab Results  Component Value Date   NA 140 03/20/2020   K 4.1 03/20/2020   CO2 24 03/20/2020   GLUCOSE 76 03/20/2020   BUN 10 03/20/2020   CREATININE 0.89 03/20/2020   BILITOT 1.3 (H) 03/20/2020   ALKPHOS 52 10/25/2012   AST 17 03/20/2020   ALT 31 03/20/2020   PROT 6.5 03/20/2020   ALBUMIN 4.2 10/25/2012   CALCIUM 9.0 03/20/2020   Lab Results  Component Value Date   CHOL 167 12/07/2018   Lab Results  Component Value Date   HDL 39 (L) 12/07/2018   Lab Results  Component Value Date   LDLCALC 107 (H) 12/07/2018   Lab Results  Component Value Date   TRIG 114 12/07/2018   Lab Results  Component Value Date   CHOLHDL 4.3 12/07/2018    No results found for: HGBA1C  Assessment & Plan:   Problem List Items Addressed This Visit   None Visit Diagnoses     Screening for colon cancer    -  Primary   Relevant Orders   Cologuard   Elevated bilirubin       Relevant Orders   B12   Vitamin B6   Vitamin B1   TSH   CBC with Differential/Platelet   Bilirubin, fractionated (tot/dir/indir)   Pathologist smear review   Retic   Magnesium   Neuropathy       Relevant Orders   B12   Vitamin B6   Vitamin B1   TSH   CBC with Differential/Platelet   Bilirubin, fractionated (tot/dir/indir)   Pathologist smear review   Retic   Magnesium   Bilateral thigh pain       Relevant Orders   Ambulatory referral to Vascular Surgery       Elevated bilirubin in looking back over the years he is always had a slight elevation always usually under 3.  Most consistent with Veto Kemps but will do additional work-up including pathology smear, reticulocyte count and fractionated bilirubin.  He has not had any jaundice.  Neuropathy he is describing pretty persistent neuropathy in both feet mostly in the toes we discussed this can come from a couple of different causes.  Would like to start by checking for nutritional deficiencies and if those are negative then will refer to neurology for further evaluation and possible nerve conduction.  Lateral thigh pain-unclear etiology consider peripheral vascular disease that this typically will show up in the lower legs and calves first.  Though he did recently have a DVT in that left lower leg is also complaining of burning in the shin area.  Further evaluation by vascular would actually be very helpful to see if some of this could be peripheral vascular disease versus venous insufficiency causing discomfort.  No orders of the defined types were placed in this encounter.   Follow-up: No follow-ups on file.    Nani Gasser, MD

## 2021-01-21 ENCOUNTER — Encounter: Payer: Self-pay | Admitting: Family Medicine

## 2021-01-21 NOTE — Progress Notes (Signed)
Trevell, B12 looks good.  Thyroid level looks perfect.  Blood count is normal no sign of anemia.  Culicide count is normal.  Magnesium looks great.  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  It affects about 6% of the general population so its not uncommon.  When I look back you have had mildly elevated bilirubin levels for years so its not new.  With [employer] the bilirubin rarely goes above 3.  It is typically an inherited issue.  Again it rarely causes problems unless she become very ill in which case it can cause some mild jaundice.  Abdomen B1, B6 and peripheral smear are still pending

## 2021-01-21 NOTE — Telephone Encounter (Signed)
Okay to use Coricidin.  To stay away from products that have aspirin but if Tylenol is the main fever reducer or pain reliever than that is perfectly fine.

## 2021-01-23 LAB — CBC WITH DIFFERENTIAL/PLATELET
Absolute Monocytes: 474 cells/uL (ref 200–950)
Basophils Absolute: 38 cells/uL (ref 0–200)
Basophils Relative: 0.6 %
Eosinophils Absolute: 173 cells/uL (ref 15–500)
Eosinophils Relative: 2.7 %
HCT: 45.8 % (ref 38.5–50.0)
Hemoglobin: 15.2 g/dL (ref 13.2–17.1)
Lymphs Abs: 1184 cells/uL (ref 850–3900)
MCH: 30.4 pg (ref 27.0–33.0)
MCHC: 33.2 g/dL (ref 32.0–36.0)
MCV: 91.6 fL (ref 80.0–100.0)
MPV: 10.5 fL (ref 7.5–12.5)
Monocytes Relative: 7.4 %
Neutro Abs: 4531 cells/uL (ref 1500–7800)
Neutrophils Relative %: 70.8 %
Platelets: 159 10*3/uL (ref 140–400)
RBC: 5 10*6/uL (ref 4.20–5.80)
RDW: 12.2 % (ref 11.0–15.0)
Total Lymphocyte: 18.5 %
WBC: 6.4 10*3/uL (ref 3.8–10.8)

## 2021-01-23 LAB — TSH: TSH: 0.83 mIU/L (ref 0.40–4.50)

## 2021-01-23 LAB — VITAMIN B1: Vitamin B1 (Thiamine): 12 nmol/L (ref 8–30)

## 2021-01-23 LAB — VITAMIN B12: Vitamin B-12: 408 pg/mL (ref 200–1100)

## 2021-01-23 LAB — RETICULOCYTES
ABS Retic: 55000 cells/uL (ref 25000–90000)
Retic Ct Pct: 1.1 %

## 2021-01-23 LAB — MAGNESIUM: Magnesium: 2.2 mg/dL (ref 1.5–2.5)

## 2021-01-23 LAB — BILIRUBIN, FRACTIONATED(TOT/DIR/INDIR)
Bilirubin, Direct: 0.3 mg/dL — ABNORMAL HIGH (ref 0.0–0.2)
Indirect Bilirubin: 1.1 mg/dL (calc) (ref 0.2–1.2)
Total Bilirubin: 1.4 mg/dL — ABNORMAL HIGH (ref 0.2–1.2)

## 2021-01-23 LAB — PATHOLOGIST SMEAR REVIEW

## 2021-01-23 LAB — VITAMIN B6: Vitamin B6: 7.9 ng/mL (ref 2.1–21.7)

## 2021-01-23 NOTE — Progress Notes (Signed)
Vi B1 and the smear are normal.  Still awaiting B6.

## 2021-01-27 ENCOUNTER — Telehealth: Payer: Self-pay | Admitting: Family Medicine

## 2021-01-27 DIAGNOSIS — G629 Polyneuropathy, unspecified: Secondary | ICD-10-CM

## 2021-01-27 NOTE — Telephone Encounter (Signed)
Please call patient and let him know that since all his labs actually came back pretty good I would like to refer him to neurology for the neuropathy in his feet if he is okay with that then please let me know and I will place referral.  Also I wanted to correct a misspelling ending note on the labs.  I think he has a condition called "Gilberts" in regards to his bilirubin.  I was using dictation software and so it misspelled it.

## 2021-01-27 NOTE — Progress Notes (Signed)
Vitamin B6 looks good as well.  At this point everything looks normal.  I think my nurse may have tried to reach out to you today to discuss possible neurology referral I think that would really be the next step.

## 2021-01-27 NOTE — Telephone Encounter (Signed)
LVM for pt to call to discuss.  T. Makailee Nudelman, CMA  

## 2021-01-28 NOTE — Telephone Encounter (Signed)
Spoke with pt who is agreeable to referral to neurology.  Pt stated that if there are no neurologist in [place], he would like to be referred to someone in [place].  Tiajuana Amass, CMA

## 2021-01-29 NOTE — Telephone Encounter (Signed)
Referral placed.

## 2021-02-27 ENCOUNTER — Encounter: Payer: Self-pay | Admitting: Family Medicine

## 2021-04-22 LAB — COLOGUARD: COLOGUARD: POSITIVE — AB

## 2021-04-22 NOTE — Progress Notes (Signed)
Hi Jake Pearson, your Cologuard came back positive which means we need to refer you for a colonoscopy.  If you have a preference for provider or location then please let me know.  But this does mean that you will need additional work-up.

## 2021-04-22 NOTE — Addendum Note (Signed)
Addended by: Fonnie Mu on: 04/22/2021 04:30 PM   Modules accepted: Orders

## 2021-04-28 ENCOUNTER — Encounter: Payer: Self-pay | Admitting: Gastroenterology

## 2021-05-22 ENCOUNTER — Ambulatory Visit: Payer: BC Managed Care – PPO | Admitting: Gastroenterology

## 2021-05-31 ENCOUNTER — Other Ambulatory Visit: Payer: Self-pay | Admitting: Family Medicine

## 2021-08-11 ENCOUNTER — Emergency Department
Admission: EM | Admit: 2021-08-11 | Discharge: 2021-08-11 | Disposition: A | Discharge status: Home / Self Care | Payer: BC Managed Care – PPO | Source: Home / Self Care | Attending: Family Medicine | Admitting: Family Medicine

## 2021-08-11 ENCOUNTER — Emergency Department (INDEPENDENT_AMBULATORY_CARE_PROVIDER_SITE_OTHER): Payer: BC Managed Care – PPO

## 2021-08-11 DIAGNOSIS — M25522 Pain in left elbow: Secondary | ICD-10-CM | POA: Diagnosis not present

## 2021-08-11 DIAGNOSIS — M19122 Post-traumatic osteoarthritis, left elbow: Secondary | ICD-10-CM | POA: Diagnosis not present

## 2021-08-11 MED ORDER — PREDNISONE 20 MG PO TABS: ORAL_TABLET | ORAL | 0 refills | Status: DC

## 2021-08-11 NOTE — Discharge Instructions (Signed)
May take Tylenol as needed for pain. 

## 2021-08-11 NOTE — ED Provider Notes (Signed)
?[facility] ? ? ? ?CSN: [id number] ?Arrival date & time: 08/11/21  1554 ? ? ?  ? ?History   ?Chief Complaint ?Chief Complaint  ?Patient presents with  ? Elbow Pain  ? ? ?HPI ?Jake Pearson is a 66 y.o. male.  ? ?Patient complains of pain, swelling, and decreased range of motion of his left elbow for about one week.  He is left-handed and denies recent injury, but states that he had an avulsion fracture to the elbow in 1976. ? ?The history is provided by the patient.  ? ?Past Medical History:  ?Diagnosis Date  ? COVID 01/2020  ? Enlarged prostate   ? History of nephrolithiasis   ? Mid back pain 10/13/2017  ? ? ?Patient Active Problem List  ? Diagnosis Date Noted  ? 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 Pulmonary embolism and infarction (HCC) 12/05/2020  ? Acute deep vein thrombosis (DVT) of proximal vein of left lower extremity (HCC) 12/05/2020  ? Sensation of chest tightness 02/22/2020  ? Personal history of COVID-19 02/22/2020  ? BPH associated with nocturia 05/30/2019  ? 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 BPPV (benign paroxysmal positional vertigo), right 12/28/2018  ? Aortic atherosclerosis (HCC) 12/04/2018  ? Kidney stones 12/04/2018  ? Pleural effusion, right 12/04/2018  ? Lumbar degenerative disc disease 01/12/2018  ? Snoring 10/13/2017  ? 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 Fatty liver 10/13/2017  ? Arthritis of right acromioclavicular joint 07/22/2017  ? Hearing loss of left ear 11/17/2016  ? ? ?Past Surgical History:  ?Procedure Laterality Date  ? ARTHROSCOPIC REPAIR ACL    ? KNEE ARTHROCENTESIS    ? TONSILLECTOMY  Age 89   ? VASECTOMY    ? ? ? ? ? ?Home Medications   ? ?Prior to Admission medications   ?Medication Sig Start Date End Date Taking? Authorizing Provider  ?acetaminophen (TYLENOL) 325 MG tablet Take 650 mg by mouth every 6 (six) hours as needed.   Yes [provider]  ?predniSONE (DELTASONE) 20 MG tablet Take one tab by mouth twice daily for 4 days,  then one daily. Take with food. 08/11/21  Yes Lattie HawBeese, Nickia Boesen A, MD  ?albuterol (VENTOLIN HFA) 108 (90 Base) MCG/ACT inhaler Inhale 1-2 puffs into the lungs every 4 (four) hours as needed for wheezing or shortness of breath. 12/03/20   Agapito GamesMetheney, Catherine D, MD  ?pantoprazole (PROTONIX) 40 MG tablet Take 1 tablet (40 mg total) by mouth daily. 12/24/20   Agapito GamesMetheney, Catherine D, MD  ?Carlena HurlXARELTO 20 MG TABS tablet Take 1 tablet (20 mg total) by mouth daily with supper. 06/01/21   Agapito GamesMetheney, Catherine D, MD  ? ? ?Family History ?Family History  ?Problem Relation Age of Onset  ? Healthy Mother   ? Healthy Father   ? Heart attack Maternal Grandfather   ? Diabetes Maternal Aunt   ? Diabetes Maternal Uncle   ? Heart attack Maternal Uncle   ? ? ?Social History ?Social History  ? ?Tobacco Use  ? Smoking status: Never  ? Smokeless tobacco: Never  ?Vaping Use  ? Vaping Use: Never used  ?Substance Use Topics  ? Alcohol use: Yes  ?  Comment: rarely  ? Drug use: No  ? ? ? ?Allergies   ?Patient has no known allergies. ? ? ?Review of Systems ?Review of Systems  ?Constitutional: Negative.   ?Musculoskeletal:  Positive for arthralgias and joint swelling.  ?Skin:  Negative for color change and wound.  ?All other systems reviewed and  are negative. ? ? ?Physical Exam ?Triage Vital Signs ?ED Triage Vitals  ?Enc Vitals Group  ?   BP 08/11/21 1610 115/78  ?   Pulse Rate 08/11/21 1610 70  ?   Resp 08/11/21 1610 20  ?   Temp 08/11/21 1610 98.3 ?F (36.8 ?C)  ?   Temp Source 08/11/21 1610 Oral  ?   SpO2 08/11/21 1610 97 %  ?   Weight 08/11/21 1606 175 lb (79.4 kg)  ?   Height 08/11/21 1606 5\' 8"  (1.727 m)  ?   Head Circumference --   ?   Peak Flow --   ?   Pain Score 08/11/21 1606 1  ?   Pain Loc --   ?   Pain Edu? --   ?   Excl. in GC? --   ? ?No data found. ? ?Updated Vital Signs ?BP 115/78 (BP Location: Left Arm)   Pulse 70   Temp 98.3 ?F (36.8 ?C) (Oral)   Resp 20   Ht 5\' 8"  (1.727 m)   Wt 79.4 kg   SpO2 97%   BMI 26.61 kg/m?  ? ?Visual  Acuity ?Right Eye Distance:   ?Left Eye Distance:   ?Bilateral Distance:   ? ?Right Eye Near:   ?Left Eye Near:    ?Bilateral Near:    ? ?Physical Exam ?Vitals reviewed.  ?Constitutional:   ?   General: He is not in acute distress. ?HENT:  ?   Head: Atraumatic.  ?Cardiovascular:  ?   Rate and Rhythm: Normal rate.  ?Pulmonary:  ?   Effort: Pulmonary effort is normal.  ?Musculoskeletal:  ?   Left elbow: Swelling and effusion present. Decreased range of motion. Tenderness present.  ?   Comments: Left elbow has mild tenderness to palpation in area of olecranon.  No erythema or warmth.  Decreased range of motion:  patient cannot fully extend actively or passively and flexion limited to 90 degrees.  Distal neurovascular function is intact.   ?Skin: ?   General: Skin is warm and dry.  ?   Findings: No rash.  ?Neurological:  ?   General: No focal deficit present.  ?   Mental Status: He is alert.  ? ? ? ?UC Treatments / Results  ?Labs ?(all labs ordered are listed, but only abnormal results are displayed) ?Labs Reviewed - No data to display ? ?EKG ? ? ?Radiology ?DG Elbow Complete Left ? ?Result Date: 08/11/2021 ?CLINICAL DATA:  Pain no injury EXAM: LEFT ELBOW - COMPLETE 3+ VIEW COMPARISON:  None. FINDINGS: Normal alignment and no fracture.  Positive for joint effusion Advanced degenerative change in the elbow with joint space narrowing and spurring IMPRESSION: Normal alignment and no fracture No fracture is identified however there is a joint effusion. Fusion could be due to occult fracture but likely is due to advanced degenerative changes. Electronically Signed   By: M.D.   On: 08/11/2021 16:25   ? ?Procedures ?Procedures (including critical care time) ? ?Medications Ordered in UC ?Medications - No data to display ? ?Initial Impression / Assessment and Plan / UC Course  ?I have reviewed the triage vital signs and the nursing notes. ? ?Pertinent labs & imaging results that were available during my care of the  patient were reviewed by me and considered in my medical decision making (see chart for details). ? ?  ?Advanced degenerative changes left elbow probably a result of past injury.  No evidence acute injury. ?Begin prednisone  burst/taper. ?Followup with orthopedist as soon as possible.  ? ?Final Clinical Impressions(s) / UC Diagnoses  ? ?Final diagnoses:  ?Post-traumatic osteoarthritis of left elbow  ? ? ? ?Discharge Instructions   ? ?  ?May take Tylenol as needed for pain. ? ? ? ?ED Prescriptions   ? ? Medication Sig Dispense Auth. Provider  ? predniSONE (DELTASONE) 20 MG tablet Take one tab by mouth twice daily for 4 days, then one daily. Take with food. 12 tablet Lattie Haw, MD  ? ?  ? ? ?  ?Lattie Haw, MD ?08/13/21 [phone] ? ?

## 2021-08-11 NOTE — ED Triage Notes (Addendum)
Pt presents to Urgent Care with pain and decreased mobility to L elbow x approx 1 week, no known injury. Reports having "chipped bone" in this elbow in 1976.  ?

## 2021-09-15 ENCOUNTER — Encounter: Payer: Self-pay | Admitting: Emergency Medicine

## 2021-09-15 ENCOUNTER — Emergency Department
Admission: EM | Admit: 2021-09-15 | Discharge: 2021-09-15 | Disposition: A | Discharge status: Home / Self Care | Payer: BC Managed Care – PPO | Source: Home / Self Care

## 2021-09-15 DIAGNOSIS — H6691 Otitis media, unspecified, right ear: Secondary | ICD-10-CM

## 2021-09-15 DIAGNOSIS — J309 Allergic rhinitis, unspecified: Secondary | ICD-10-CM

## 2021-09-15 DIAGNOSIS — R059 Cough, unspecified: Secondary | ICD-10-CM

## 2021-09-15 DIAGNOSIS — J0100 Acute maxillary sinusitis, unspecified: Secondary | ICD-10-CM | POA: Diagnosis not present

## 2021-09-15 MED ORDER — BENZONATATE 200 MG PO CAPS: 200.0000 mg | ORAL_CAPSULE | Freq: Three times a day (TID) | ORAL | 0 refills | Status: AC | PRN

## 2021-09-15 MED ORDER — AMOXICILLIN-POT CLAVULANATE 875-125 MG PO TABS: 1.0000 | ORAL_TABLET | Freq: Two times a day (BID) | ORAL | 0 refills | Status: AC

## 2021-09-15 MED ORDER — PREDNISONE 50 MG PO TABS: ORAL_TABLET | ORAL | 0 refills | Status: DC

## 2021-09-15 MED ORDER — FEXOFENADINE HCL 180 MG PO TABS: 180.0000 mg | ORAL_TABLET | Freq: Every day | ORAL | 0 refills | Status: DC

## 2021-09-15 NOTE — ED Provider Notes (Signed)
[facility]    CSN: [id number] Arrival date & time: 09/15/21  1548      History   Chief Complaint Chief Complaint  Patient presents with   Facial Pain    HPI Jake Pearson is a 66 y.o. male.   HPI Pleasant 66 year old male presents with ear fullness, postnasal drainage, cough, and headache for 6 days.  Reports taking OTC Mucinex for symptoms.  Reports nausea and decreased appetite due to postnasal drainage.  PMH significant for PE, DVT, and bladder diverticulum.  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   BPPV (benign paroxysmal positional vertigo), right 12/28/2018   Aortic atherosclerosis (HCC) 12/04/2018   Kidney stones 12/04/2018   Pleural effusion, right 12/04/2018   Lumbar degenerative disc disease 01/12/2018   Snoring 10/13/2017   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 Authorizing Provider  amoxicillin-clavulanate (AUGMENTIN) 875-125 MG tablet Take 1 tablet by mouth 2 (two) times daily for 10 days. 09/15/21 09/25/21 Yes Trevor Ihaagan, Bruna Dills, FNP  benzonatate (TESSALON) 200 MG capsule  Take 1 capsule (200 mg total) by mouth 3 (three) times daily as needed for up to 7 days for cough. 09/15/21 09/22/21 Yes Trevor Ihaagan, Breck Hollinger, FNP  fexofenadine [facility](ALLEGRA ALLERGY) 180 MG tablet Take 1 tablet (180 mg total) by mouth daily for 15 days. 09/15/21 09/30/21 Yes Trevor Ihaagan, Edee Nifong, FNP  pantoprazole (PROTONIX) 40 MG tablet Take 1 tablet (40 mg total) by mouth daily. 12/24/20  Yes Agapito GamesMetheney, Catherine D, MD  predniSONE (DELTASONE) 50 MG tablet Take 1 tab p.o. daily for 5 days. 09/15/21  Yes Trevor Ihaagan, Lonald Troiani, FNP  XARELTO 20 MG TABS tablet Take 1 tablet (20 mg total) by mouth daily with supper. 06/01/21  Yes Agapito GamesMetheney, Catherine D, MD  acetaminophen (TYLENOL) 325 MG tablet Take 650 mg by mouth every 6 (six) hours as needed.    [provider]  albuterol (VENTOLIN HFA) 108 (90 Base) MCG/ACT inhaler Inhale 1-2 puffs into the lungs every 4 (four) hours as needed for wheezing or shortness of breath. 12/03/20   Agapito GamesMetheney, Catherine D, MD    Family History Family History  Problem Relation Age of Onset   Healthy Mother    Healthy Father    Heart attack Maternal Grandfather    Diabetes Maternal Aunt    Diabetes Maternal Uncle    Heart attack Maternal Uncle     Social History Social History   Tobacco Use   Smoking status: Never   Smokeless tobacco: Never  Vaping Use   Vaping Use: Never used  Substance Use Topics   Alcohol use: Yes    Comment: rarely   Drug use: No     Allergies   Patient has no known allergies.   Review of Systems Review of Systems  HENT:  Positive for congestion, ear pain and postnasal drip.   Respiratory:  Positive for cough.   Gastrointestinal:  Positive for nausea.  All other systems reviewed and are negative.   Physical Exam Triage Vital Signs ED Triage Vitals [09/15/21 1559]  Enc Vitals Group     BP      Pulse      Resp      Temp      Temp src      SpO2      Weight      Height      Head Circumference      Peak Flow      Pain Score 4     Pain Loc       Pain Edu?      Excl. in GC?    No data found.  Updated Vital Signs BP 122/81 (BP Location: Right Arm)   Pulse 66   Temp 98.3 F (36.8 C) (Oral)   Resp 16   SpO2 96%    Physical Exam Vitals and nursing note reviewed.  Constitutional:      Appearance: Normal appearance. Jake Pearson is normal weight.  HENT:     Head: Normocephalic and atraumatic.     Right Ear: External ear normal. Decreased hearing noted. Tympanic membrane is erythematous and retracted.     Left Ear: Tympanic membrane and external ear normal. Decreased hearing noted.     Ears:     Comments: Moderate eustachian tube dysfunction noted bilaterally    Nose:     Comments: Turbinates are erythematous/edematous    Mouth/Throat:     Mouth: Mucous membranes are moist.     Pharynx: Oropharynx is clear. Uvula midline.     Comments: Moderate amount of clear drainage of posterior oropharynx noted Eyes:     Extraocular Movements: Extraocular movements intact.     Conjunctiva/sclera: Conjunctivae normal.     Pupils: Pupils are equal, round, and reactive to light.  Cardiovascular:     Rate and Rhythm: Normal rate and regular rhythm.     Pulses: Normal pulses.     Heart sounds: Normal heart sounds.  Pulmonary:     Effort: Pulmonary effort is normal.     Breath sounds: Normal breath sounds. No wheezing, rhonchi or rales.     Comments: Infrequent nonproductive cough noted on exam Musculoskeletal:     Cervical back: Normal range of motion and neck supple.  Skin:    General: Skin is warm and dry.  Neurological:     General: No focal deficit present.     Mental Status: Jake Pearson is alert and oriented to person, place, and time.     UC Treatments / Results  Labs (all labs ordered are listed, but only abnormal results are displayed) Labs Reviewed - No data to display  EKG   Radiology No results found.  Procedures Procedures (including critical care time)  Medications Ordered in UC Medications - No data to display  Initial  Impression / Assessment and Plan / UC Course  I have reviewed the triage vital signs and the nursing notes.  Pertinent labs & imaging results that were available during my care of the patient were reviewed by me and considered in my medical decision making (see chart  for details).     MDM: 1.  Acute maxillary sinusitis, recurrence not specified-Rx'd Augmentin; 2.  Acute right otitis media-Rx'd Augmentin; 3.  Allergic rhinitis-Rx'd Allegra; 4.  Cough-Rx'd Tessalon Perles. Instructed patient to discontinue OTC Mucinex and take medication as directed with food to completion.  Advised patient to take 1 dose of Augmentin tonight with dinner.  Advised beginning tomorrow morning, Wednesday, 09/16/2021 take Prednisone and Allegra with first dose of Augmentin for the next 10 5 of 9 days.  Advised may use Tessalon Perles daily or as needed for cough.  Encouraged patient to increase daily water intake while taking these medications.  Advised patient if symptoms worsen and/or unresolved please follow-up with PCP or here for further evaluation.  Patient discharged home, hemodynamically stable. Final Clinical Impressions(s) / UC Diagnoses   Final diagnoses:  Acute maxillary sinusitis, recurrence not specified  Acute right otitis media  Allergic rhinitis, unspecified seasonality, unspecified trigger  Cough, unspecified type     Discharge Instructions      Instructed patient to discontinue OTC Mucinex and take medication as directed with food to completion.  Advised patient to take 1 dose of Augmentin tonight with dinner.  Advised beginning tomorrow morning, Wednesday, 09/16/2021 take Prednisone and Allegra with first dose of Augmentin for the next 10 5 of 9 days.  Advised may use Tessalon Perles daily or as needed for cough.  Encouraged patient to increase daily water intake while taking these medications.  Advised patient if symptoms worsen and/or unresolved please follow-up with PCP or here for further  evaluation.     ED Prescriptions     Medication Sig Dispense Auth. Provider   amoxicillin-clavulanate (AUGMENTIN) 875-125 MG tablet Take 1 tablet by mouth 2 (two) times daily for 10 days. 20 tablet Trevor Iha, FNP   predniSONE (DELTASONE) 50 MG tablet Take 1 tab p.o. daily for 5 days. 5 tablet Trevor Iha, FNP   fexofenadine [facility] ALLERGY) 180 MG tablet Take 1 tablet (180 mg total) by mouth daily for 15 days. 15 tablet Trevor Iha, FNP   benzonatate (TESSALON) 200 MG capsule Take 1 capsule (200 mg total) by mouth 3 (three) times daily as needed for up to 7 days for cough. 40 capsule Trevor Iha, FNP      PDMP not reviewed this encounter.   Trevor Iha, FNP 09/15/21 1624

## 2021-09-15 NOTE — Discharge Instructions (Addendum)
Instructed patient to discontinue OTC Mucinex and take medication as directed with food to completion.  Advised patient to take 1 dose of Augmentin tonight with dinner.  Advised beginning tomorrow morning, Wednesday, 09/16/2021 take Prednisone and Allegra with first dose of Augmentin for the next 10 5 of 9 days.  Advised may use Tessalon Perles daily or as needed for cough.  Encouraged patient to increase daily water intake while taking these medications.  Advised patient if symptoms worsen and/or unresolved please follow-up with PCP or here for further evaluation.

## 2021-09-15 NOTE — ED Triage Notes (Addendum)
Patient presents to Urgent Care with complaints of postnasal drainage, cough, ear fullness  and headache since 6 days ago. Patient reports taking Mucinex for symptoms. Having nausea, decrease appetite due to drainage. Jake Pearson

## 2021-11-20 ENCOUNTER — Encounter: Payer: Self-pay | Admitting: Emergency Medicine

## 2021-11-20 ENCOUNTER — Ambulatory Visit (INDEPENDENT_AMBULATORY_CARE_PROVIDER_SITE_OTHER): Payer: BC Managed Care – PPO

## 2021-11-20 ENCOUNTER — Ambulatory Visit
Admission: EM | Admit: 2021-11-20 | Discharge: 2021-11-20 | Disposition: A | Discharge status: Home / Self Care | Payer: BC Managed Care – PPO | Attending: Family Medicine | Admitting: Family Medicine

## 2021-11-20 DIAGNOSIS — M5412 Radiculopathy, cervical region: Secondary | ICD-10-CM

## 2021-11-20 DIAGNOSIS — M542 Cervicalgia: Secondary | ICD-10-CM

## 2021-11-20 DIAGNOSIS — R079 Chest pain, unspecified: Secondary | ICD-10-CM

## 2021-11-20 MED ORDER — PREDNISONE 20 MG PO TABS: ORAL_TABLET | ORAL | 0 refills | Status: DC

## 2021-11-20 NOTE — ED Triage Notes (Signed)
Pt states pain comes and goes - has resolved on its own in the past  Pain started on Sunday morning when he woke up from sleeping on the couch Per pt the pain will start when he moves around or when he moves his neck around  Describes pain as a tender sensation No pain meds or reflux meds  Pt took 2 doses amoxicillin   ( one on Mon & one on Wed )

## 2021-11-20 NOTE — ED Provider Notes (Signed)
[facility]    CSN: [id number] Arrival date & time: 11/20/21  1058      History   Chief Complaint Chief Complaint  Patient presents with   Muscle Pain    HPI Jake Pearson is a 66 y.o. male.   Patient reports that he slept on a couch five days ago, awakening the next morning with positional pain in his upper chest and upper mid-back.  His symptoms have persisted.  He states that he has had similar symptoms in the past that always resolve spontaneously.  He feels well otherwise.  The history is provided by the patient.    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   BPPV (benign paroxysmal positional vertigo), right 12/28/2018   Aortic atherosclerosis (HCC) 12/04/2018   Kidney stones 12/04/2018   Pleural effusion, right 12/04/2018   Lumbar degenerative disc disease 01/12/2018   Snoring 10/13/2017   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 Authorizing Provider  predniSONE (DELTASONE) 20 MG tablet Take one tab by mouth twice daily for 4 days, then one daily. Take with food. 11/20/21  Yes  Lattie Haw, MD  acetaminophen (TYLENOL) 325 MG tablet Take 650 mg by mouth every 6 (six) hours as needed.    [provider]  albuterol (VENTOLIN HFA) 108 (90 Base) MCG/ACT inhaler Inhale 1-2 puffs into the lungs every 4 (four) hours as needed for wheezing or shortness of breath. 12/03/20   Agapito Games, MD  fexofenadine (ALLEGRA ALLERGY) 180 MG tablet Take 1 tablet (180 mg total) by mouth daily for 15 days. Patient not taking: Reported on 11/20/2021 09/15/21 09/30/21  Trevor Iha, FNP  pantoprazole (PROTONIX) 40 MG tablet Take 1 tablet (40 mg total) by mouth daily. 12/24/20   Agapito Games, MD  XARELTO 20 MG TABS tablet Take 1 tablet (20 mg total) by mouth daily with supper. 06/01/21   Agapito Games, MD    Family History Family History  Problem Relation Age of Onset   Healthy Mother    Healthy Father    Heart attack Maternal Grandfather    Diabetes Maternal Aunt    Diabetes Maternal Uncle    Heart attack Maternal Uncle     Social History Social History   Tobacco Use   Smoking status: Never   Smokeless tobacco: Never  Vaping Use   Vaping Use: Never used  Substance Use Topics   Alcohol use: Yes    Comment: rarely   Drug use: No  Allergies   Patient has no known allergies.   Review of Systems Review of Systems  Constitutional:  Negative for chills, diaphoresis and fatigue.  HENT: Negative.    Eyes: Negative.   Respiratory: Negative.    Cardiovascular:  Negative for chest pain, palpitations and leg swelling.  Gastrointestinal: Negative.   Genitourinary: Negative.   Musculoskeletal:  Positive for back pain and neck pain.  Skin: Negative.   Neurological:  Negative for headaches.     Physical Exam Triage Vital Signs ED Triage Vitals  Enc Vitals Group     BP 11/20/21 1136 123/82     Pulse Rate 11/20/21 1136 (!) 59     Resp 11/20/21 1136 16     Temp 11/20/21 1136 98.3 F (36.8 C)     Temp Source 11/20/21 1136 Oral     SpO2  11/20/21 1136 98 %     Weight 11/20/21 1141 174 lb (78.9 kg)     Height 11/20/21 1141 5\' 8"  (1.727 m)     Head Circumference --      Peak Flow --      Pain Score 11/20/21 1140 0     Pain Loc --      Pain Edu? --      Excl. in GC? --    No data found.  Updated Vital Signs BP 123/82 (BP Location: Left Arm)   Pulse (!) 59   Temp 98.3 F (36.8 C) (Oral)   Resp 16   Ht 5\' 8"  (1.727 m)   Wt 78.9 kg   SpO2 98%   BMI 26.46 kg/m   Visual Acuity Right Eye Distance:   Left Eye Distance:   Bilateral Distance:    Right Eye Near:   Left Eye Near:    Bilateral Near:     Physical Exam Vitals and nursing note reviewed.  Constitutional:      General: He is not in acute distress.    Appearance: He is not ill-appearing.  HENT:     Head: Normocephalic.     Mouth/Throat:     Pharynx: Oropharynx is clear.  Eyes:     Pupils: Pupils are equal, round, and reactive to light.  Neck:      Comments: Mild tenderness to palpation trapezius muscles as noted on diagram.  Patient has recurrence of mild pain in his upper mid-chest and trapezius area with extension and forward flexion of his neck.   Cardiovascular:     Rate and Rhythm: Bradycardia present.     Heart sounds: Normal heart sounds.  Pulmonary:     Breath sounds: Normal breath sounds.  Chest:       Comments: Mild tenderness to palpation upper mid-chest as noted on diagram.   Abdominal:     Palpations: Abdomen is soft.     Tenderness: There is no abdominal tenderness.  Musculoskeletal:        General: No tenderness.     Cervical back: Normal range of motion and neck supple.     Right lower leg: No edema.     Left lower leg: No edema.  Lymphadenopathy:     Cervical: No cervical adenopathy.  Skin:    General: Skin is warm and dry.     Findings: No rash.  Neurological:     Mental Status: He is alert and oriented to person, place, and time.      UC Treatments / Results  Labs (all labs ordered are listed, but only  abnormal results are displayed)  Labs Reviewed - No data to display  EKG   Radiology DG Cervical Spine Complete  Result Date: 11/20/2021 CLINICAL DATA:  5 day history intermittent upper chest and posterior neck pain with neck flexion/extension EXAM: CERVICAL SPINE - COMPLETE 4+ VIEW COMPARISON:  None Available. FINDINGS: There is no radiographically evident cervical spine fracture. The cervicothoracic junction is poorly visualized on lateral view due to overlying anatomy. There is mild-to-moderate multilevel degenerative disc disease, most prominent at C4-C5, C5-C6, and C6-C7. There is mild-to-moderate multilevel facet arthropathy. Mild right neural foraminal narrowing at C2-C3. Nuchal ossifications. IMPRESSION: Mild-to-moderate multilevel degenerative disc disease, most prominent from C4 through C7. Mild to moderate multilevel facet arthropathy. Electronically Signed   By: Caprice Renshaw M.D.   On: 11/20/2021 13:30    Procedures Procedures (including critical care time)  Medications Ordered in UC Medications - No data to display  Initial Impression / Assessment and Plan / UC Course  I have reviewed the triage vital signs and the nursing notes.  Pertinent labs & imaging results that were available during my care of the patient were reviewed by me and considered in my medical decision making (see chart for details).    Begin prednisone burst/taper. Followup with Dr. Rodney [facility]ton ([facility]) if not improving about two weeks.   Final Clinical Impressions(s) / UC Diagnoses   Final diagnoses:  Cervical radiculopathy     Discharge Instructions      Apply ice pack to back of neck for 20 to 30 minutes, 2 to 3 times daily  Continue until pain decreases.     ED Prescriptions     Medication Sig Dispense Auth. Provider   predniSONE (DELTASONE) 20 MG tablet Take one tab by mouth twice daily for 4 days, then one daily. Take with food. 12 tablet Lattie Haw, MD          Lattie Haw, MD 11/22/21 [phone]

## 2021-11-20 NOTE — Discharge Instructions (Signed)
Apply ice pack to back of neck for 20 to 30 minutes, 2 to 3 times daily  Continue until pain decreases.  

## 2021-12-01 ENCOUNTER — Other Ambulatory Visit: Payer: Self-pay | Admitting: Family Medicine

## 2021-12-02 ENCOUNTER — Ambulatory Visit (INDEPENDENT_AMBULATORY_CARE_PROVIDER_SITE_OTHER): Payer: BC Managed Care – PPO | Admitting: Sports Medicine

## 2021-12-02 DIAGNOSIS — M25522 Pain in left elbow: Secondary | ICD-10-CM | POA: Diagnosis not present

## 2021-12-02 DIAGNOSIS — M5030 Other cervical disc degeneration, unspecified cervical region: Secondary | ICD-10-CM | POA: Diagnosis not present

## 2021-12-02 DIAGNOSIS — M19022 Primary osteoarthritis, left elbow: Secondary | ICD-10-CM | POA: Insufficient documentation

## 2021-12-02 NOTE — Assessment & Plan Note (Signed)
This is a very pleasant 66 year old male, he was seen in urgent care with some neck pain, anterior and posterior, with radiation to the periscapular region as well as the upper chest. He was given some prednisone and symptoms improved, he has very little discomfort, x-rays did show multilevel cervical spondylosis, adding home conditioning, he tells me he is not hurting enough to try an NSAID or Tylenol. Return to see me as needed.

## 2021-12-02 NOTE — Progress Notes (Signed)
    Procedures performed today:    None.  Independent interpretation of notes and tests performed by another provider:   None.  Brief History, Exam, Impression, and Recommendations:    DDD (degenerative disc disease), cervical This is a very pleasant 66 year old male, he was seen in urgent care with some neck pain, anterior and posterior, with radiation to the periscapular region as well as the upper chest. He was given some prednisone and symptoms improved, he has very little discomfort, x-rays did show multilevel cervical spondylosis, adding home conditioning, he tells me he is not hurting enough to try an NSAID or Tylenol. Return to see me as needed.  Left elbow pain Chronic left pain with what appears to be somewhat of a valgus deformity, 5 to 7 degrees of extension lag, feels like bony impingement. I did explain this was likely osteoarthritis, and that we would be unlikely to get his elbow completely straight without an operative intervention, he feels that he can live with that right now so we will do no imaging and no intervention for now.    ____________________________________________ Ihor Austin. Benjamin Stain, M.D., ABFM., CAQSM., AME. Primary Care and Sports Medicine [facility]  Adjunct Professor of Family Medicine  [employer]  Restaurant manager, fast food

## 2021-12-02 NOTE — Assessment & Plan Note (Signed)
Chronic left pain with what appears to be somewhat of a valgus deformity, 5 to 7 degrees of extension lag, feels like bony impingement. I did explain this was likely osteoarthritis, and that we would be unlikely to get his elbow completely straight without an operative intervention, he feels that he can live with that right now so we will do no imaging and no intervention for now.

## 2021-12-11 ENCOUNTER — Encounter: Payer: Self-pay | Admitting: Family Medicine

## 2021-12-11 ENCOUNTER — Ambulatory Visit: Payer: BC Managed Care – PPO | Admitting: Family Medicine

## 2021-12-11 VITALS — BP 113/74 | HR 73 | Ht 68.0 in | Wt 175.8 lb

## 2021-12-11 DIAGNOSIS — K219 Gastro-esophageal reflux disease without esophagitis: Secondary | ICD-10-CM | POA: Diagnosis not present

## 2021-12-11 DIAGNOSIS — Z86711 Personal history of pulmonary embolism: Secondary | ICD-10-CM

## 2021-12-11 DIAGNOSIS — Z86718 Personal history of other venous thrombosis and embolism: Secondary | ICD-10-CM

## 2021-12-11 DIAGNOSIS — I2699 Other pulmonary embolism without acute cor pulmonale: Secondary | ICD-10-CM | POA: Diagnosis not present

## 2021-12-11 MED ORDER — PANTOPRAZOLE SODIUM 40 MG PO TBEC: 40.0000 mg | DELAYED_RELEASE_TABLET | Freq: Every day | ORAL | 1 refills | Status: DC

## 2021-12-11 NOTE — Progress Notes (Signed)
Established Patient Office Visit  Subjective   Patient ID: Jake Pearson, male    DOB: 09-12-1955  Age: 66 y.o. MRN: [id number]  Chief Complaint  Patient presents with   Medication Refill    HPI  1 year f/u for  for f/u of pulmonary embolism and DVT.  He started experiencing some left lower leg swelling and went to the emergency department on August 12.[name]  He did have a DVT involving the left femoral, popliteal and posterior tibial and peroneal veins.  He was prescribed Xarelto.  He was experiencing shortness of breath which has really honestly been a chronic issue for him.  But family encouraged him to go to the emergency department on August 14.  Because he had just been diagnosed with a DVT 2 days prior they went ahead and did a chest CT.  He was noted to have right lower pulmonary emboli with possible right heart strain.  He is otherwise doing well and does not have any new problems or concerns.    ROS    Objective:     BP 113/74 (BP Location: Left Arm, Patient Position: Sitting, Cuff Size: Normal)   Pulse 73   Ht 5\' 8"  (1.727 m)   Wt 175 lb 12.8 oz (79.7 kg)   SpO2 95%   BMI 26.73 kg/m    Physical Exam Constitutional:      Appearance: He is well-developed.  HENT:     Head: Normocephalic and atraumatic.  Cardiovascular:     Rate and Rhythm: Normal rate and regular rhythm.     Heart sounds: Normal heart sounds.  Pulmonary:     Effort: Pulmonary effort is normal.     Breath sounds: Normal breath sounds.  Skin:    General: Skin is warm and dry.  Neurological:     Mental Status: He is alert and oriented to person, place, and time.  Psychiatric:        Behavior: Behavior normal.      No results found for any visits on 12/11/21.    The ASCVD Risk score (Arnett DK, et al., 2019) failed to calculate for the following reasons:   Cannot find a previous HDL lab   Cannot find a previous total cholesterol lab    Assessment & Plan:   Problem List Items  Addressed This Visit       Cardiovascular and Mediastinum   Pulmonary embolism and infarction [facility])    We will go ahead and discontinue the Xarelto.  He has completed 1 year of therapy we will get additional lab work today for hypercoagulability work-up.  At the time we felt likely secondary to prolonged sitting had been working long 10-hour days.  Discussed the importance of getting up and moving frequently throughout the day and staying well-hydrated.  Also encouraged him to consider wearing compression stockings with long travel whether in the car or flying and again continuing to stop and take breaks and stretch etc.        Digestive   GERD (gastroesophageal reflux disease)    Refilled PPI.       Relevant Medications   pantoprazole (PROTONIX) 40 MG tablet     Other   History of pulmonary embolism - Primary   Relevant Orders   Venous Thrombosis Hypercoagulability Panel with Reflex   History of DVT of lower extremity   Relevant Orders   Venous Thrombosis Hypercoagulability Panel with Reflex    No follow-ups on file.    [facility], MD

## 2021-12-11 NOTE — Progress Notes (Deleted)
   Established Patient Office Visit  Subjective   Patient ID: Jake Pearson, male    DOB: 1955-06-10  Age: 66 y.o. MRN: [id number]  Chief Complaint  Patient presents with   Medication Refill    HPI  {History (Optional):23778}  ROS    Objective:     BP 113/74 (BP Location: Left Arm, Patient Position: Sitting, Cuff Size: Normal)   Pulse 73   Ht 5\' 8"  (1.727 m)   Wt 175 lb 12.8 oz (79.7 kg)   SpO2 95%   BMI 26.73 kg/m  {Vitals History (Optional):23777}  Physical Exam   No results found for any visits on 12/11/21.  {Labs (Optional):23779}  The ASCVD Risk score (Arnett DK, et al., 2019) failed to calculate for the following reasons:   Cannot find a previous HDL lab   Cannot find a previous total cholesterol lab    Assessment & Plan:   Problem List Items Addressed This Visit       Digestive   GERD (gastroesophageal reflux disease)   Relevant Medications   pantoprazole (PROTONIX) 40 MG tablet     Other   History of pulmonary embolism - Primary   Relevant Orders   Venous Thrombosis Hypercoagulability Panel with Reflex   History of DVT of lower extremity   Relevant Orders   Venous Thrombosis Hypercoagulability Panel with Reflex    No follow-ups on file.    2020, MD

## 2021-12-11 NOTE — Assessment & Plan Note (Signed)
We will go ahead and discontinue the Xarelto.  He has completed 1 year of therapy we will get additional lab work today for hypercoagulability work-up.  At the time we felt likely secondary to prolonged sitting had been working long 10-hour days.  Discussed the importance of getting up and moving frequently throughout the day and staying well-hydrated.  Also encouraged him to consider wearing compression stockings with long travel whether in the car or flying and again continuing to stop and take breaks and stretch etc.

## 2021-12-11 NOTE — Assessment & Plan Note (Signed)
Refilled PPI 

## 2021-12-16 ENCOUNTER — Ambulatory Visit (INDEPENDENT_AMBULATORY_CARE_PROVIDER_SITE_OTHER): Payer: BC Managed Care – PPO

## 2021-12-16 ENCOUNTER — Ambulatory Visit (INDEPENDENT_AMBULATORY_CARE_PROVIDER_SITE_OTHER): Payer: BC Managed Care – PPO | Admitting: Sports Medicine

## 2021-12-16 DIAGNOSIS — M25522 Pain in left elbow: Secondary | ICD-10-CM

## 2021-12-16 DIAGNOSIS — M19022 Primary osteoarthritis, left elbow: Secondary | ICD-10-CM | POA: Diagnosis not present

## 2021-12-16 NOTE — Progress Notes (Signed)
    Procedures performed today:    Procedure: Real-time Ultrasound Guided injection of the left elbow Device: Samsung HS60  Verbal informed consent obtained.  Time-out conducted.  Noted no overlying erythema, induration, or other signs of local infection.  Skin prepped in a sterile fashion.  Local anesthesia: Topical Ethyl chloride.  With sterile technique and under real time ultrasound guidance: Using a posterior/lateral approach through the anconeus I entered an arthritic appearing joint, 1 cc Kenalog 40, 1 cc lidocaine, 1 cc bupivacaine injected easily Completed without difficulty  Advised to call if fevers/chills, erythema, induration, drainage, or persistent bleeding.  Images permanently stored and available for review in PACS.  Impression: Technically successful ultrasound guided injection.  Independent interpretation of notes and tests performed by another provider:   None.  Brief History, Exam, Impression, and Recommendations:    Primary osteoarthritis, left elbow Pleasant 66 year old male, x-ray confirmed end-stage/severe osteoarthritis left elbow with mild valgus deformity, he also has about 5 degrees of flexion and extension lag. Pain is severe, we injected his elbow today, he understands that he may get some pain relief but he is unlikely to get improvement in the motion. We can do the shot again in 3 months if needed, he can do NSAIDs over-the-counter in the meantime, return to see me as needed.    ____________________________________________ Jake Pearson. Benjamin Stain, M.D., ABFM., CAQSM., AME. Primary Care and Sports Medicine [facility]  Adjunct Professor of Family Medicine  [employer]  Restaurant manager, fast food

## 2021-12-16 NOTE — Assessment & Plan Note (Signed)
Pleasant 66 year old male, x-ray confirmed end-stage/severe osteoarthritis left elbow with mild valgus deformity, he also has about 5 degrees of flexion and extension lag. Pain is severe, we injected his elbow today, he understands that he may get some pain relief but he is unlikely to get improvement in the motion. We can do the shot again in 3 months if needed, he can do NSAIDs over-the-counter in the meantime, return to see me as needed.

## 2021-12-17 ENCOUNTER — Encounter: Payer: Self-pay | Admitting: Sports Medicine

## 2021-12-18 LAB — VENOUS THROMBOSIS HYPERCOAGULABILITY PANEL WITH REFLEX
ACTIVATED PROTEIN C RESISTANCE: 5.2 ratio (ref >=2.1)
AntiThromb III Func: 95 % normal (ref 80–135)
Anticardiolipin IgG: <2 GPL-U/mL (ref <20.0)
Anticardiolipin IgM: 17.4 MPL-U/mL (ref <20.0)
Beta-2 Glyco 1 IgM: 15.7 U/mL (ref <20.0)
Beta-2 Glyco I IgG: <2 U/mL (ref <20.0)
Factor-VIII Activity: 126 % normal (ref 50–180)
PROTEIN S ANTIGEN, TOTAL: 107 % normal (ref 70–140)
PROTHROMBIN (FACTOR II): NEGATIVE
PTT-LA Screen: 37 s (ref <=40)
Protein C Activity: 167 % normal (ref 70–180)
Protein S Ag, Free: 113 % normal (ref 57–171)
dRVVT: 58 s — ABNORMAL HIGH (ref <=45)

## 2021-12-18 LAB — RFLX DRVVT CONFRIM: DRVVT CONFIRM: NEGATIVE

## 2021-12-22 NOTE — Progress Notes (Signed)
Hi Jake Pearson, all labs look good in regards to nothing to indicate an increased risk for blood clots.  It is good news so hopefully you will never have a recurrence.  Just encourage you to stay hydrated and keep moving.  Make sure to wear compression stockings with prolonged sitting or traveling.

## 2022-01-27 ENCOUNTER — Ambulatory Visit: Payer: BC Managed Care – PPO | Admitting: Sports Medicine

## 2022-05-06 ENCOUNTER — Ambulatory Visit
Admission: EM | Admit: 2022-05-06 | Discharge: 2022-05-06 | Disposition: A | Discharge status: Home / Self Care | Payer: BC Managed Care – PPO | Attending: Emergency Medicine | Admitting: Emergency Medicine

## 2022-05-06 DIAGNOSIS — N390 Urinary tract infection, site not specified: Secondary | ICD-10-CM | POA: Insufficient documentation

## 2022-05-06 LAB — POCT URINALYSIS DIP (MANUAL ENTRY)
Bilirubin, UA: NEGATIVE
Glucose, UA: NEGATIVE mg/dL
Nitrite, UA: POSITIVE — AB
Protein Ur, POC: 30 mg/dL — AB
Spec Grav, UA: 1.025 (ref 1.010–1.025)
Urobilinogen, UA: 0.2 E.U./dL
pH, UA: 5.5 (ref 5.0–8.0)

## 2022-05-06 MED ORDER — CEFTRIAXONE SODIUM 1 G IJ SOLR
1000.0000 mg | Freq: Once | INTRAMUSCULAR | Status: AC
  Administered 2022-05-06: 1000 mg via INTRAMUSCULAR

## 2022-05-06 MED ORDER — ONDANSETRON 4 MG PO TBDP: 4.0000 mg | ORAL_TABLET | Freq: Three times a day (TID) | ORAL | 0 refills | Status: AC | PRN

## 2022-05-06 MED ORDER — ONDANSETRON 4 MG PO TBDP
4.0000 mg | ORAL_TABLET | Freq: Once | ORAL | Status: AC
  Administered 2022-05-06: 4 mg via ORAL

## 2022-05-06 MED ORDER — ONDANSETRON HCL 4 MG/2ML IJ SOLN: 4.0000 mg | Freq: Once | INTRAMUSCULAR | Status: DC

## 2022-05-06 MED ORDER — SULFAMETHOXAZOLE-TRIMETHOPRIM 800-160 MG PO TABS: 1.0000 | ORAL_TABLET | Freq: Two times a day (BID) | ORAL | 0 refills | Status: AC

## 2022-05-06 NOTE — Discharge Instructions (Signed)
Based on the information that you shared with me today as well as the results of the analysis of your urine, I believe that you are suffering from a complicated urinary tract infection.  You received an injection of a strong antibiotic called ceftriaxone during your visit today which should significantly relieve your symptoms at this time.  It is important that you pick up and begin taking a second antibiotic called Bactrim twice daily for the next 7 days to completely resolve the infection in your urinary tract at this time.  Please take your first dose this evening.  We will send your urine to our lab for culture, this will be completed in the next 3 to 5 days.  You will receive a phone call with results of the urine culture.  If there is a better antibiotic recommended based on the results of your urine culture, that will be provided for you.  Please be I thought I ordered the oral sure that you are drinking plenty of clear fluids, at least 80 ounces per day.  It is also important that you rest so that your body has enough energy to fight this infection. I provided you with a note to be at home for the next few days just in case you need 1.  Thank you for visiting urgent care today.

## 2022-05-06 NOTE — ED Triage Notes (Signed)
Pt c/o urinary frequency and dysuria since yesterday. Also c/o nausea.  Hx of GERD.

## 2022-05-06 NOTE — ED Provider Notes (Signed)
[facility]    CSN: [id number] Arrival date & time: 05/06/22  1014    HISTORY   Chief Complaint  Patient presents with   Nausea   Dysuria    And frequency   HPI Jake Pearson is a pleasant, 66 y.o. male who presents to urgent care today. Patient complains of increased frequency of urination, burning with urination and sensation of incomplete emptying that began yesterday.  Patient states has also been feeling nauseated, experiencing joint pain and bodyaches but denies fever, difficulty initiating urine stream and dribbling.  Patient states he experiences some form of urinary tract infection about once a year but has never been seen by urologist for this or required any preventative treatment.  The history is provided by the patient.   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/05/2020   Acute deep vein thrombosis (DVT) of proximal vein of left lower extremity ([place]) 12/05/2020   Sensation of chest tightness 02/22/2020   Personal history of COVID-19 02/22/2020   BPH associated with nocturia 05/30/2019   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   BPPV (benign paroxysmal positional vertigo), right 12/28/2018   Aortic atherosclerosis ([place]) 12/04/2018   Kidney stones 12/04/2018   Pleural effusion, right 12/04/2018   Lumbar degenerative disc disease 01/12/2018   Snoring 10/13/2017   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 Authorizing Provider  famotidine (PEPCID) 40 MG tablet Take 40 mg by mouth. 03/02/22  Yes [provider]  acetaminophen (TYLENOL) 325 MG tablet Take 650 mg by mouth every 6 (six) hours as needed.    [provider]  albuterol (VENTOLIN HFA) 108 (90 Base) MCG/ACT inhaler Inhale 1-2 puffs into the lungs every 4 (four) hours as needed for wheezing or shortness of breath. 12/03/20   Hali Marry, MD  pantoprazole (PROTONIX) 40 MG tablet Take 1 tablet (40 mg total) by mouth daily. 12/11/21   Hali Marry, MD    Family History Family History  Problem Relation Age of Onset   Healthy Mother    Healthy Father    Heart attack Maternal Grandfather    Diabetes Maternal Aunt    Diabetes Maternal Uncle    Heart attack Maternal Uncle    Social History Social History   Tobacco Use   Smoking status: Never   Smokeless tobacco: Never  Vaping Use   Vaping Use: Never used  Substance Use Topics   Alcohol use: Yes    Comment: rarely   Drug use: No   Allergies   Patient has no known allergies.  Review of Systems Review  of Systems Pertinent findings revealed after performing a 14 point review of systems has been noted in the history of present illness.  Physical Exam Triage Vital Signs ED Triage Vitals  Enc Vitals Group     BP 02/13/21 0827 (!) 147/82     Pulse Rate 02/13/21 0827 72     Resp 02/13/21 0827 18     Temp 02/13/21 0827 98.3 F (36.8 C)     Temp Source 02/13/21 0827 Oral     SpO2 02/13/21 0827 98 %     Weight --      Height --      Head Circumference --      Peak Flow --      Pain Score 02/13/21 0826 5     Pain Loc --      Pain Edu? --       Excl. in [place]? --   No data found.  Updated Vital Signs BP 126/74 (BP Location: Right Arm)   Pulse 87   Temp 98.8 F (37.1 C) (Oral)   Resp 17   SpO2 96%   Physical Exam Vitals and nursing note reviewed.  Constitutional:      General: He is not in acute distress.    Appearance: Normal appearance. He is normal weight. He is not ill-appearing.  HENT:     Head: Normocephalic and atraumatic.  Eyes:     Extraocular Movements: Extraocular movements intact.     Conjunctiva/sclera: Conjunctivae normal.     Pupils: Pupils are equal, round, and reactive to light.  Cardiovascular:     Rate and Rhythm: Normal rate and regular rhythm.  Pulmonary:     Effort: Pulmonary effort is normal.     Breath sounds: Normal breath sounds.  Abdominal:     General: Abdomen is flat. Bowel sounds are normal.     Palpations: Abdomen is soft.     Tenderness: There is no right CVA tenderness or left CVA tenderness.  Genitourinary:    Comments: Patient politely declined GU exam today. Musculoskeletal:        General: Normal range of motion.     Cervical back: Normal range of motion and neck supple.  Skin:    General: Skin is warm and dry.  Neurological:     General: No focal deficit present.     Mental Status: He is alert and oriented to person, place, and time. Mental status is at baseline.  Psychiatric:        Mood and Affect: Mood normal.        Behavior: Behavior normal.        Thought Content: Thought content normal.        Judgment: Judgment normal.     Visual Acuity Right Eye Distance:   Left Eye Distance:   Bilateral Distance:    Right Eye Near:   Left Eye Near:    Bilateral Near:     UC Couse / Diagnostics / Procedures:     Radiology No results found.  Procedures Procedures (including critical care time) EKG  Pending results:  Labs Reviewed  POCT URINALYSIS DIP (MANUAL ENTRY) - Abnormal; Notable for the following components:      Result Value   Clarity, UA cloudy (*)     Ketones, POC UA small (15) (*)    Blood, UA moderate (*)    Protein Ur, POC =30 (*)    Nitrite, UA Positive (*)    Leukocytes, UA Moderate (2+) (*)  All other components within normal limits    Medications Ordered in UC: Medications  cefTRIAXone (ROCEPHIN) injection 1,000 mg (has no administration in time range)  ondansetron (ZOFRAN-ODT) disintegrating tablet 4 mg (4 mg Oral Given 05/06/22 1050)    UC Diagnoses / Final Clinical Impressions(s)   I have reviewed the triage vital signs and the nursing notes.  Pertinent labs & imaging results that were available during my care of the patient were reviewed by me and considered in my medical decision making (see chart for details).    Final diagnoses:  Complicated UTI (urinary tract infection)   Complains of body ache and joint pain, believe that patient is suffering from a complicated urinary tract infection.  Patient was provided with injection of ceftriaxone for rapid relief of his symptoms given that he is having nausea and bodyaches concerning for possible undetected fever.  Patient provided with a 7-day course of Bactrim.  Urine culture will be performed, we will notify patient of results once received and if antibiotics need to be adjusted based on that result, patient will be provided with a prescription.  Patient advised to drink plenty of clear fluids and rest.  Return precautions advised.   Please see discharge instructions below for details of plan of care as provided to patient. ED Prescriptions     Medication Sig Dispense Auth. Provider   sulfamethoxazole-trimethoprim (BACTRIM DS) 800-160 MG tablet Take 1 tablet by mouth 2 (two) times daily for 7 days. 14 tablet Theadora Rama Scales, PA-C   ondansetron (ZOFRAN-ODT) 4 MG disintegrating tablet Take 1 tablet (4 mg total) by mouth every 8 (eight) hours as needed for up to 3 days for nausea or vomiting. 9 tablet Theadora Rama Scales, PA-C      PDMP not reviewed this  encounter.  Pending results:  Labs Reviewed  POCT URINALYSIS DIP (MANUAL ENTRY) - Abnormal; Notable for the following components:      Result Value   Clarity, UA cloudy (*)    Ketones, POC UA small (15) (*)    Blood, UA moderate (*)    Protein Ur, POC =30 (*)    Nitrite, UA Positive (*)    Leukocytes, UA Moderate (2+) (*)    All other components within normal limits    Discharge Instructions:   Discharge Instructions      Based on the information that you shared with me today as well as the results of the analysis of your urine, I believe that you are suffering from a complicated urinary tract infection.  You received an injection of a strong antibiotic called ceftriaxone during your visit today which should significantly relieve your symptoms at this time.  It is important that you pick up and begin taking a second antibiotic called Bactrim twice daily for the next 7 days to completely resolve the infection in your urinary tract at this time.  Please take your first dose this evening.  We will send your urine to our lab for culture, this will be completed in the next 3 to 5 days.  You will receive a phone call with results of the urine culture.  If there is a better antibiotic recommended based on the results of your urine culture, that will be provided for you.  Please be I thought I ordered the oral sure that you are drinking plenty of clear fluids, at least 80 ounces per day.  It is also important that you rest so that your body has enough energy to fight this  infection. I provided you with a note to be at home for the next few days just in case you need 1.  Thank you for visiting urgent care today.      Disposition Upon Discharge:  Condition: stable for discharge home  Patient presented with an acute illness with associated systemic symptoms and significant discomfort requiring urgent management. In my opinion, this is a condition that a prudent lay person (someone who  possesses an average knowledge of health and medicine) may potentially expect to result in complications if not addressed urgently such as respiratory distress, impairment of bodily function or dysfunction of bodily organs.   Routine symptom specific, illness specific and/or disease specific instructions were discussed with the patient and/or caregiver at length.   As such, the patient has been evaluated and assessed, work-up was performed and treatment was provided in alignment with urgent care protocols and evidence based medicine.  Patient/parent/caregiver has been advised that the patient may require follow up for further testing and treatment if the symptoms continue in spite of treatment, as clinically indicated and appropriate.  Patient/parent/caregiver has been advised to return to the [facility] or PCP if no better; to PCP or the Emergency Department if new signs and symptoms develop, or if the current signs or symptoms continue to change or worsen for further workup, evaluation and treatment as clinically indicated and appropriate  The patient will follow up with their current PCP if and as advised. If the patient does not currently have a PCP we will assist them in obtaining one.   The patient may need specialty follow up if the symptoms continue, in spite of conservative treatment and management, for further workup, evaluation, consultation and treatment as clinically indicated and appropriate.  Patient/parent/caregiver verbalized understanding and agreement of plan as discussed.  All questions were addressed during visit.  Please see discharge instructions below for further details of plan.  This office note has been dictated using Teaching laboratory technician.  Unfortunately, this method of dictation can sometimes lead to typographical or grammatical errors.  I apologize for your inconvenience in advance if this occurs.  Please do not hesitate to reach out to me if clarification is needed.       Theadora Rama Scales, PA-C 05/06/22 1054

## 2022-05-08 LAB — URINE CULTURE: Culture: >=100000 — AB

## 2022-05-26 ENCOUNTER — Encounter: Payer: Self-pay | Admitting: Family Medicine

## 2022-05-27 ENCOUNTER — Ambulatory Visit
Admission: EM | Admit: 2022-05-27 | Discharge: 2022-05-27 | Disposition: A | Discharge status: Home / Self Care | Payer: BC Managed Care – PPO | Attending: Physician Assistant | Admitting: Physician Assistant

## 2022-05-27 DIAGNOSIS — N390 Urinary tract infection, site not specified: Secondary | ICD-10-CM | POA: Diagnosis not present

## 2022-05-27 LAB — POCT URINALYSIS DIP (MANUAL ENTRY)
Bilirubin, UA: NEGATIVE
Glucose, UA: NEGATIVE mg/dL
Ketones, POC UA: NEGATIVE mg/dL
Nitrite, UA: POSITIVE — AB
Spec Grav, UA: >=1.03 — AB (ref 1.010–1.025)
Urobilinogen, UA: 0.2 E.U./dL
pH, UA: 5.5 (ref 5.0–8.0)

## 2022-05-27 MED ORDER — CEFTRIAXONE SODIUM 1 G IJ SOLR
1.0000 g | Freq: Once | INTRAMUSCULAR | Status: AC
  Administered 2022-05-27: 1 g via INTRAMUSCULAR

## 2022-05-27 MED ORDER — CEPHALEXIN 500 MG PO CAPS: 500.0000 mg | ORAL_CAPSULE | Freq: Four times a day (QID) | ORAL | 0 refills | Status: DC

## 2022-05-27 NOTE — ED Triage Notes (Signed)
Patient presents to [facility] for urinary concerns. States he was seen 01/18 for UTI. Reports urinary freq and odor x 3 days ago.

## 2022-05-27 NOTE — ED Provider Notes (Signed)
[facility]    CSN: [id number] Arrival date & time: 05/27/22  1540      History   Chief Complaint Chief Complaint  Patient presents with   Urinary Frequency    HPI Jake Pearson is a 67 y.o. male.   Lanes of burning with urination and bodyaches.  Patient reports he was treated a few weeks ago for urinary tract infection.  Patient reports all symptoms did resolve however they have started again.  Denies any past history of prostatitis  The history is provided by the patient. No language interpreter was used.  Urinary Frequency Pertinent negatives include no chest pain and no abdominal pain.    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[place]) 12/05/2020   Acute deep vein thrombosis (DVT) of proximal vein of left lower extremity ([place]) 12/05/2020   Sensation of chest tightness 02/22/2020   Personal history of COVID-19 02/22/2020   BPH associated with nocturia 05/30/2019   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   BPPV (benign paroxysmal positional vertigo), right 12/28/2018   Aortic atherosclerosis ([place]) 12/04/2018   Kidney stones 12/04/2018   Pleural effusion, right 12/04/2018   Lumbar degenerative disc disease 01/12/2018   Snoring 10/13/2017   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 Authorizing Provider  acetaminophen (TYLENOL) 325 MG tablet Take 650 mg by mouth every 6 (six) hours as needed.    [provider]  albuterol (VENTOLIN HFA) 108 (90 Base) MCG/ACT inhaler Inhale 1-2 puffs into the lungs every 4 (four) hours as needed for wheezing or shortness of breath. 12/03/20   Hali Marry, MD  famotidine (PEPCID) 40 MG tablet Take 40 mg by mouth. 03/02/22   [provider]    Family History Family History  Problem Relation Age of Onset   Healthy Mother    Healthy Father    Heart attack Maternal Grandfather    Diabetes Maternal Aunt    Diabetes Maternal Uncle    Heart attack Maternal Uncle     Social History Social History   Tobacco Use   Smoking status: Never   Smokeless tobacco: Never  Vaping Use   Vaping Use: Never used  Substance Use Topics   Alcohol use: Yes    Comment: rarely   Drug use: No     Allergies   Patient has no known allergies.   Review of Systems Review of Systems  Cardiovascular:  Negative for chest pain.  Gastrointestinal:  Negative for abdominal pain.  Genitourinary:  Positive for frequency.  All other systems reviewed and are negative.  Physical Exam Triage Vital Signs ED Triage Vitals [05/27/22 1556]  Enc Vitals Group     BP 117/84     Pulse Rate 80     Resp 16     Temp (!) 97.4 F (36.3 C)     Temp Source Oral     SpO2 96 %     Weight      Height      Head Circumference      Peak Flow      Pain Score 0     Pain Loc      Pain Edu?      Excl. in [place]?    No data found.  Updated Vital Signs BP 117/84 (BP Location: Left Arm)   Pulse 80   Temp (!) 97.4 F (36.3 C) (Oral)   Resp 16   SpO2 96%   Visual Acuity Right Eye Distance:   Left Eye Distance:   Bilateral Distance:    Right Eye Near:   Left Eye Near:     Bilateral Near:     Physical Exam Vitals and nursing note reviewed.  Constitutional:      Appearance: He is well-developed.  HENT:     Head: Normocephalic.  Cardiovascular:     Rate and Rhythm: Normal rate.  Pulmonary:     Effort: Pulmonary effort is normal.  Abdominal:     General: There is no distension.  Musculoskeletal:        General: Normal range of motion.     Cervical back: Normal range of motion.  Skin:    General: Skin is warm.  Neurological:     General: No focal deficit present.     Mental Status: He is alert and oriented to person, place, and time.      UC Treatments / Results  Labs (all labs ordered are listed, but only abnormal results are displayed) Labs Reviewed  POCT URINALYSIS DIP (MANUAL ENTRY) - Abnormal; Notable for the following components:      Result Value   Spec Grav, UA >=1.030 (*)    Blood, UA trace-intact (*)    Protein Ur, POC trace (*)    Nitrite, UA Positive (*)    Leukocytes, UA Small (1+) (*)    All other components within normal limits  URINE CULTURE    EKG   Radiology No results found.  Procedures Procedures (including critical care time)  Medications Ordered in UC Medications  cefTRIAXone (ROCEPHIN) injection 1 g (has no administration in time range)    Initial Impression / Assessment and Plan / UC Course  I have reviewed the triage vital signs and the nursing notes.  Pertinent labs & imaging results that were available during my care of the patient were reviewed by me and considered in my medical decision making (see chart for details).     MDM: Patient is given injection of Rocephin he is given a prescription for Keflex he is advised to follow-up with his primary care physician for recheck in 1 week I will order a urine culture patient is advised his physician may want to refer him to urology for recheck Final Clinical Impressions(s) / UC Diagnoses   Final diagnoses:  Urinary tract infection without  hematuria, site unspecified   Discharge Instructions   None    ED Prescriptions     Medication Sig Dispense Auth. Provider   cephALEXin (KEFLEX) 500 MG capsule Take 1 capsule (500 mg total) by mouth 4 (four) times daily. 28 capsule  Fransico Meadow, PA-C     An After Visit Summary was printed and given to the patient.     PDMP not reviewed this encounter.   Fransico Meadow, [place] 05/27/22 1614

## 2022-05-30 LAB — URINE CULTURE: Culture: >=100000 — AB

## 2022-09-28 ENCOUNTER — Ambulatory Visit (INDEPENDENT_AMBULATORY_CARE_PROVIDER_SITE_OTHER): Payer: BC Managed Care – PPO

## 2022-09-28 ENCOUNTER — Ambulatory Visit: Payer: BC Managed Care – PPO | Admitting: Sports Medicine

## 2022-09-28 DIAGNOSIS — M25562 Pain in left knee: Secondary | ICD-10-CM

## 2022-09-28 DIAGNOSIS — M25561 Pain in right knee: Secondary | ICD-10-CM

## 2022-09-28 DIAGNOSIS — M25551 Pain in right hip: Secondary | ICD-10-CM

## 2022-09-28 DIAGNOSIS — M1711 Unilateral primary osteoarthritis, right knee: Secondary | ICD-10-CM

## 2022-09-28 DIAGNOSIS — M1611 Unilateral primary osteoarthritis, right hip: Secondary | ICD-10-CM

## 2022-09-28 MED ORDER — MELOXICAM 15 MG PO TABS: ORAL_TABLET | ORAL | 3 refills | Status: DC

## 2022-09-28 NOTE — Assessment & Plan Note (Addendum)
Also with pain right groin, some loss of internal rotation with reproduction of pain. As below x-rays, meloxicam, home PT, adding a Mediterranean diet, return in 6 weeks, injection if not better.

## 2022-09-28 NOTE — Patient Instructions (Signed)

## 2022-09-28 NOTE — Assessment & Plan Note (Signed)
Pain right knee, worse with terminal flexion, suspect osteoarthritis. Adding x-rays, meloxicam, home physical therapy, return to see me in 6 weeks, injection if not better.

## 2022-09-28 NOTE — Progress Notes (Addendum)
    Procedures performed today:    None.  Independent interpretation of notes and tests performed by another provider:   None.  Brief History, Exam, Impression, and Recommendations:    Primary osteoarthritis of right knee Pain right knee, worse with terminal flexion, suspect osteoarthritis. Adding x-rays, meloxicam, home physical therapy, return to see me in 6 weeks, injection if not better.  Primary osteoarthritis of right hip Also with pain right groin, some loss of internal rotation with reproduction of pain. As below x-rays, meloxicam, home PT, adding a Mediterranean diet, return in 6 weeks, injection if not better.    ____________________________________________ Ihor Austin. Benjamin Stain, M.D., ABFM., CAQSM., AME. Primary Care and Sports Medicine [facility]  Adjunct Professor of Family Medicine  [place] of [facility] [employer]  Restaurant manager, fast food

## 2022-11-09 ENCOUNTER — Ambulatory Visit: Payer: BC Managed Care – PPO | Admitting: Sports Medicine

## 2022-11-09 DIAGNOSIS — M1611 Unilateral primary osteoarthritis, right hip: Secondary | ICD-10-CM

## 2022-11-09 DIAGNOSIS — M1711 Unilateral primary osteoarthritis, right knee: Secondary | ICD-10-CM

## 2022-11-09 NOTE — Assessment & Plan Note (Signed)
Hip osteoarthritis better with meloxicam, he only needs it occasionally, continue home conditioning and he can return to see me as needed for this.

## 2022-11-09 NOTE — Assessment & Plan Note (Signed)
Knee osteoarthritis better with meloxicam, continue home conditioning and return as needed.

## 2022-11-09 NOTE — Progress Notes (Signed)
    Procedures performed today:    None.  Independent interpretation of notes and tests performed by another provider:   None.  Brief History, Exam, Impression, and Recommendations:    Primary osteoarthritis of right knee Knee osteoarthritis better with meloxicam, continue home conditioning and return as needed.  Primary osteoarthritis of right hip Hip osteoarthritis better with meloxicam, he only needs it occasionally, continue home conditioning and he can return to see me as needed for this.    ____________________________________________ Ihor Austin. Benjamin Stain, M.D., ABFM., CAQSM., AME. Primary Care and Sports Medicine [facility]  Adjunct Professor of Family Medicine  [place] of [facility] [employer]  Restaurant manager, fast food

## 2022-12-13 ENCOUNTER — Ambulatory Visit (INDEPENDENT_AMBULATORY_CARE_PROVIDER_SITE_OTHER): Payer: BC Managed Care – PPO | Admitting: Sports Medicine

## 2022-12-13 ENCOUNTER — Telehealth: Payer: Self-pay | Admitting: General Practice

## 2022-12-13 ENCOUNTER — Other Ambulatory Visit (INDEPENDENT_AMBULATORY_CARE_PROVIDER_SITE_OTHER): Payer: BC Managed Care – PPO

## 2022-12-13 DIAGNOSIS — M1611 Unilateral primary osteoarthritis, right hip: Secondary | ICD-10-CM

## 2022-12-13 DIAGNOSIS — M1711 Unilateral primary osteoarthritis, right knee: Secondary | ICD-10-CM

## 2022-12-13 MED ORDER — TRIAMCINOLONE ACETONIDE 40 MG/ML IJ SUSP
40.0000 mg | Freq: Once | INTRAMUSCULAR | Status: AC
  Administered 2022-12-13: 40 mg via INTRAMUSCULAR

## 2022-12-13 NOTE — Assessment & Plan Note (Signed)
Jake Pearson also had right hip pain, x-rays did show some significant osteoarthritis with a pincer lesion. Historically better with meloxicam but now worsening, today he localizes hip pain laterally over the greater trochanter, he has not been consistent with his home conditioning so he will get better with the home PT before we consider greater trochanteric bursa injection. If that fails we should target the hip joint.

## 2022-12-13 NOTE — Addendum Note (Signed)
Addended by: Holland Falling on: 12/13/2022 04:48 PM   Modules accepted: Orders

## 2022-12-13 NOTE — Transitions of Care (Post Inpatient/ED Visit) (Signed)
   12/13/2022  Name: Jake Pearson MRN: [id number] DOB: [birth year]  Today's TOC FU Call Status: Today's TOC FU Call Status:: Unsuccessful Call (1st Attempt) Unsuccessful Call (1st Attempt) Date: 12/13/22  Attempted to reach the patient regarding the most recent Inpatient/ED visit.  Follow Up Plan: Additional outreach attempts will be made to reach the patient to complete the Transitions of Care (Post Inpatient/ED visit) call.   Signature Modesto Charon, Control and instrumentation engineer

## 2022-12-13 NOTE — Assessment & Plan Note (Signed)
Increasing right knee pain medial joint line without mechanical symptoms, historically better with meloxicam but now worsened. Tender to palpation medial joint line, pain with terminal flexion and pain with twisting the knee, positive Apley. Due to failure of conservative treatment we will do a knee joint injection today. Return to see me in a few weeks.

## 2022-12-13 NOTE — Progress Notes (Signed)
    Procedures performed today:    Procedure: Real-time Ultrasound Guided injection of the right knee Device: Samsung HS60  Verbal informed consent obtained.  Time-out conducted.  Noted no overlying erythema, induration, or other signs of local infection.  Skin prepped in a sterile fashion.  Local anesthesia: Topical Ethyl chloride.  With sterile technique and under real time ultrasound guidance: 1 cc Kenalog 40, 2 cc lidocaine, 2 cc bupivacaine injected easily Completed without difficulty  Advised to call if fevers/chills, erythema, induration, drainage, or persistent bleeding.  Images permanently stored and available for review in PACS.  Impression: Technically successful ultrasound guided injection.  Independent interpretation of notes and tests performed by another provider:   None.  Brief History, Exam, Impression, and Recommendations:    Primary osteoarthritis of right knee Increasing right knee pain medial joint line without mechanical symptoms, historically better with meloxicam but now worsened. Tender to palpation medial joint line, pain with terminal flexion and pain with twisting the knee, positive Apley. Due to failure of conservative treatment we will do a knee joint injection today. Return to see me in a few weeks.  Primary osteoarthritis of right hip Kullen also had right hip pain, x-rays did show some significant osteoarthritis with a pincer lesion. Historically better with meloxicam but now worsening, today he localizes hip pain laterally over the greater trochanter, he has not been consistent with his home conditioning so he will get better with the home PT before we consider greater trochanteric bursa injection. If that fails we should target the hip joint.    ____________________________________________ Ihor Austin. Benjamin Stain, M.D., ABFM., CAQSM., AME. Primary Care and Sports Medicine [facility]  Adjunct Professor of Family  Medicine  [place] of [facility] [employer]  Restaurant manager, fast food

## 2022-12-14 NOTE — Transitions of Care (Post Inpatient/ED Visit) (Signed)
   12/14/2022  Name: Jake Pearson MRN: [id number] DOB: [birth year]  Today's TOC FU Call Status: Today's TOC FU Call Status:: Unsuccessful Call (2nd Attempt) Unsuccessful Call (1st Attempt) Date: 12/13/22 Unsuccessful Call (2nd Attempt) Date: 12/14/22  Attempted to reach the patient regarding the most recent Inpatient/ED visit.  Follow Up Plan: Additional outreach attempts will be made to reach the patient to complete the Transitions of Care (Post Inpatient/ED visit) call.   Signature Modesto Charon, Control and instrumentation engineer

## 2022-12-15 NOTE — Transitions of Care (Post Inpatient/ED Visit) (Signed)
   12/15/2022  Name: Jake Pearson MRN: [id number] DOB: [birth year]  Today's TOC FU Call Status: Today's TOC FU Call Status:: Unsuccessful Call (3rd Attempt) Unsuccessful Call (1st Attempt) Date: 12/13/22 Unsuccessful Call (2nd Attempt) Date: 12/14/22 Unsuccessful Call (3rd Attempt) Date: 12/15/22  Attempted to reach the patient regarding the most recent Inpatient/ED visit.  Follow Up Plan: No further outreach attempts will be made at this time. We have been unable to contact the patient.  Signature Modesto Charon, Control and instrumentation engineer

## 2023-05-29 ENCOUNTER — Ambulatory Visit
Admission: EM | Admit: 2023-05-29 | Discharge: 2023-05-29 | Disposition: A | Discharge status: Home / Self Care | Payer: 59 | Attending: Family Medicine | Admitting: Family Medicine

## 2023-05-29 ENCOUNTER — Ambulatory Visit (INDEPENDENT_AMBULATORY_CARE_PROVIDER_SITE_OTHER): Payer: 59

## 2023-05-29 ENCOUNTER — Other Ambulatory Visit: Payer: Self-pay

## 2023-05-29 DIAGNOSIS — R509 Fever, unspecified: Secondary | ICD-10-CM

## 2023-05-29 DIAGNOSIS — J189 Pneumonia, unspecified organism: Secondary | ICD-10-CM

## 2023-05-29 DIAGNOSIS — R5383 Other fatigue: Secondary | ICD-10-CM | POA: Diagnosis not present

## 2023-05-29 DIAGNOSIS — R059 Cough, unspecified: Secondary | ICD-10-CM

## 2023-05-29 DIAGNOSIS — J09X1 Influenza due to identified novel influenza A virus with pneumonia: Secondary | ICD-10-CM | POA: Diagnosis not present

## 2023-05-29 LAB — RESP PANEL BY RT-PCR (RSV, FLU A&B, COVID)  RVPGX2
Influenza A by PCR: POSITIVE — AB
Influenza B by PCR: NEGATIVE
Resp Syncytial Virus by PCR: NEGATIVE
SARS Coronavirus 2 by RT PCR: NEGATIVE

## 2023-05-29 MED ORDER — HYDROCODONE BIT-HOMATROP MBR 5-1.5 MG/5ML PO SOLN: 5.0000 mL | Freq: Four times a day (QID) | ORAL | 0 refills | Status: DC | PRN

## 2023-05-29 MED ORDER — BENZONATATE 200 MG PO CAPS: 200.0000 mg | ORAL_CAPSULE | Freq: Three times a day (TID) | ORAL | 0 refills | Status: AC | PRN

## 2023-05-29 MED ORDER — AMOXICILLIN-POT CLAVULANATE 875-125 MG PO TABS: 1.0000 | ORAL_TABLET | Freq: Two times a day (BID) | ORAL | 0 refills | Status: AC

## 2023-05-29 MED ORDER — AZITHROMYCIN 250 MG PO TABS: 250.0000 mg | ORAL_TABLET | Freq: Every day | ORAL | 0 refills | Status: DC

## 2023-05-29 NOTE — ED Provider Notes (Signed)
 Jake Pearson [facility]    CSN: [id number] Arrival date & time: 05/29/23  1008      History   Chief Complaint Chief Complaint  Patient presents with   Cough    HPI Jake Pearson is a 68 y.o. male.   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   Pulmonary embolism and infarction (HCC) 12/05/2020   Acute deep vein thrombosis (DVT) of proximal vein of left lower extremity (HCC) 12/05/2020   Sensation of chest tightness 02/22/2020   Personal history of COVID-19 02/22/2020   BPH associated with nocturia 05/30/2019   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   BPPV (benign paroxysmal positional vertigo), right 12/28/2018   Aortic atherosclerosis (HCC) 12/04/2018   Kidney stones 12/04/2018   Pleural effusion, right 12/04/2018   Lumbar degenerative disc disease 01/12/2018   Snoring 10/13/2017   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 Authorizing Provider  amoxicillin -clavulanate (AUGMENTIN ) 875-125 MG tablet Take 1 tablet by mouth 2 (two) times daily for 10 days. 05/29/23  06/08/23 Yes Jake Sharper, FNP  azithromycin  (ZITHROMAX ) 250 MG tablet Take 1 tablet (250 mg total) by mouth daily. Take first 2 tablets together, then 1 every day until finished. 05/29/23  Yes Jake Sharper, FNP  benzonatate  (TESSALON ) 200 MG capsule Take 1 capsule (200 mg total) by mouth 3 (three) times daily as needed for up to 7 days. 05/29/23 06/05/23 Yes Jake Sharper, FNP  HYDROcodone  bit-homatropine (HYCODAN) 5-1.5 MG/5ML syrup Take 5 mLs by mouth every 6 (six) hours as needed for cough. 05/29/23  Yes Jake Sharper, FNP  acetaminophen  (TYLENOL ) 325 MG tablet Take 650 mg by mouth every 6 (six) hours as needed.    [provider]  albuterol  (VENTOLIN  HFA) 108 (90 Base) MCG/ACT inhaler Inhale 1-2 puffs into the lungs every 4 (four) hours as needed for wheezing or shortness of breath. 12/03/20   Jake Dorothyann BIRCH, MD  cephALEXin  (KEFLEX ) 500 MG capsule Take 1 capsule (500 mg total) by mouth 4 (four) times daily. 05/27/22   Sofia, Leslie K, PA-C  famotidine (PEPCID) 40 MG tablet Take 40 mg by mouth. 03/02/22   [provider]  meloxicam  (MOBIC ) 15 MG tablet One tab PO every 24 hours with a meal for 2 weeks, then once every 24 hours prn pain. 09/28/22   Jake Debby PARAS, MD    Family History Family History  Problem Relation Age of Onset   Healthy Mother    Healthy Father    Heart attack  Maternal Grandfather    Diabetes Maternal Aunt    Diabetes Maternal Uncle    Heart attack Maternal Uncle     Social History Social History   Tobacco Use   Smoking status: Never   Smokeless tobacco: Never  Vaping Use   Vaping status: Never Used  Substance Use Topics   Alcohol use: Yes    Comment: rarely   Drug use: No     Allergies   Patient has no known allergies.   Review of Systems Review of Systems   Physical Exam Triage Vital Signs ED Triage Vitals  Encounter Vitals Group     BP 05/29/23 1050 132/79     Systolic BP Percentile --      Diastolic BP Percentile  --      Pulse Rate 05/29/23 1050 70     Resp 05/29/23 1050 16     Temp 05/29/23 1050 98.3 F (36.8 C)     Temp src --      SpO2 05/29/23 1050 98 %     Weight --      Height --      Head Circumference --      Peak Flow --      Pain Score 05/29/23 1049 0     Pain Loc --      Pain Education --      Exclude from Growth Chart --    No data found.  Updated Vital Signs BP 132/79   Pulse 70   Temp 98.3 F (36.8 C)   Resp 16   SpO2 98%    Physical Exam Vitals and nursing note reviewed.  Constitutional:      Appearance: Normal appearance. He is normal weight. He is ill-appearing.  HENT:     Head: Normocephalic and atraumatic.     Right Ear: Tympanic membrane, ear canal and external ear normal.     Left Ear: Tympanic membrane, ear canal and external ear normal.     Mouth/Throat:     Mouth: Mucous membranes are moist.     Pharynx: Oropharynx is clear.  Eyes:     Extraocular Movements: Extraocular movements intact.     Conjunctiva/sclera: Conjunctivae normal.     Pupils: Pupils are equal, round, and reactive to light.  Cardiovascular:     Rate and Rhythm: Normal rate and regular rhythm.     Pulses: Normal pulses.     Heart sounds: Normal heart sounds.  Pulmonary:     Effort: Pulmonary effort is normal.     Breath sounds: No wheezing, rhonchi or rales.     Comments: Severely diminished breath sounds noted throughout, frequent nonproductive cough on exam Musculoskeletal:        General: Normal range of motion.     Cervical back: Normal range of motion and neck supple.  Skin:    General: Skin is warm and dry.  Neurological:     General: No focal deficit present.     Mental Status: He is alert and oriented to person, place, and time. Mental status is at baseline.  Psychiatric:        Mood and Affect: Mood normal.        Behavior: Behavior normal.        Thought Content: Thought content normal.      UC Treatments / Results  Labs (all labs ordered are listed, but only  abnormal results are displayed) Labs Reviewed  RESP PANEL BY RT-PCR (RSV, FLU A&B, COVID)  RVPGX2  EKG   Radiology DG Chest 2 View Result Date: 05/29/2023 CLINICAL DATA:  Cough with recent onset fevers, fatigue, and body aches. EXAM: CHEST - 2 VIEW COMPARISON:  CT chest dated March 29, 2020. Chest x-ray dated March 20, 2020. FINDINGS: The heart size and mediastinal contours are within normal limits. Normal pulmonary vascularity. Small area of consolidation in the posterior right lower lobe. Clear left lung. No pleural effusion or pneumothorax. Bilateral nipple shadows noted. No acute osseous abnormality. IMPRESSION: 1. Right lower lobe pneumonia. Electronically Signed   By: Elsie ONEIDA Shoulder M.D.   On: 05/29/2023 11:27    Procedures Procedures (including critical care time)  Medications Ordered in UC Medications - No data to display  Initial Impression / Assessment and Plan / UC Course  I have reviewed the triage vital signs and the nursing notes.  Pertinent labs & imaging results that were available during my care of the patient were reviewed by me and considered in my medical decision making (see chart for details).     MDM: 1.  Community-acquired pneumonia of right lower lobe of lung-CXR results revealed above, Rx'd Augmentin  875/125 mg tablet: Take 1 tablet twice daily x 10 days, Rx Zithromax  (500 mg day 1, then 250 mg day 2-5); 2.  Cough, unspecified type-Rx'd Tessalon  200 mg capsules: Take 1 capsule 3 times daily, as needed for cough, Rx'd Hycodan 5-1.5 mg / 5 mL syrup: Take 5 mL by mouth every 6 hours for cough. Advised patient to take medications as directed with food to completion.  Advised patient to take Zithromax  with first dose of Augmentin  to completion.  Advised patient may take Tessalon  capsules daily or as needed for cough.  Advised may use Hycodan cough syrup at night prior to sleep for cough due to sedative effects.  Encouraged to increase daily water intake to 64  ounces per day while taking these medications.  Advised if symptoms worsen and/or unresolved please follow-up with your PCP or here for further evaluation.  Advised patient to repeat chest x-ray on or about June 26, 2023 to ensure right lower lobe pneumonia has resolved.  Patient discharged home, hemodynamically stable.  Work note provided to patient prior to discharge today. Final Clinical Impressions(s) / UC Diagnoses   Final diagnoses:  Cough, unspecified type  Community acquired pneumonia of right lower lobe of lung     Discharge Instructions      Advised patient to take medications as directed with food to completion.  Advised patient to take Zithromax  with first dose of Augmentin  to completion.  Advised patient may take Tessalon  capsules daily or as needed for cough.  Advised may use Hycodan cough syrup at night prior to sleep for cough due to sedative effects.  Encouraged to increase daily water intake to 64 ounces per day while taking these medications.  Advised if symptoms worsen and/or unresolved please follow-up with your PCP or here for further evaluation.  Advised patient to repeat chest x-ray on or about June 26, 2023 to ensure right lower lobe pneumonia has resolved.     ED Prescriptions     Medication Sig Dispense Auth. Provider   amoxicillin -clavulanate (AUGMENTIN ) 875-125 MG tablet Take 1 tablet by mouth 2 (two) times daily for 10 days. 20 tablet Chivas Notz, FNP   azithromycin  (ZITHROMAX ) 250 MG tablet Take 1 tablet (250 mg total) by mouth daily. Take first 2 tablets together, then 1 every day until finished. 6 tablet Raymona Boss, FNP   benzonatate  (TESSALON ) 200 MG  capsule Take 1 capsule (200 mg total) by mouth 3 (three) times daily as needed for up to 7 days. 40 capsule Yulianna Folse, FNP   HYDROcodone  bit-homatropine (HYCODAN) 5-1.5 MG/5ML syrup Take 5 mLs by mouth every 6 (six) hours as needed for cough. 120 mL Jake Sharper, FNP      I have reviewed the PDMP  during this encounter.   Jake Sharper, FNP 05/29/23 1247

## 2023-05-29 NOTE — Discharge Instructions (Addendum)
 Advised patient to take medications as directed with food to completion.  Advised patient to take Zithromax  with first dose of Augmentin  to completion.  Advised patient may take Tessalon  capsules daily or as needed for cough.  Advised may use Hycodan cough syrup at night prior to sleep for cough due to sedative effects.  Encouraged to increase daily water intake to 64 ounces per day while taking these medications.  Advised if symptoms worsen and/or unresolved please follow-up with your PCP or here for further evaluation.  Advised patient to repeat chest x-ray on or about June 26, 2023 to ensure right lower lobe pneumonia has resolved.

## 2023-05-29 NOTE — ED Triage Notes (Signed)
 Pt presents to uc with co of cough for one month new onset of loss of appetite and fevers, fatigue, congestions and body aches for one week. 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

## 2023-09-29 ENCOUNTER — Other Ambulatory Visit (INDEPENDENT_AMBULATORY_CARE_PROVIDER_SITE_OTHER)

## 2023-09-29 ENCOUNTER — Ambulatory Visit (INDEPENDENT_AMBULATORY_CARE_PROVIDER_SITE_OTHER): Admitting: Sports Medicine

## 2023-09-29 ENCOUNTER — Ambulatory Visit

## 2023-09-29 DIAGNOSIS — M25522 Pain in left elbow: Secondary | ICD-10-CM

## 2023-09-29 DIAGNOSIS — M19022 Primary osteoarthritis, left elbow: Secondary | ICD-10-CM | POA: Diagnosis not present

## 2023-09-29 MED ORDER — TRIAMCINOLONE ACETONIDE 40 MG/ML IJ SUSP
40.0000 mg | Freq: Once | INTRAMUSCULAR | Status: AC
  Administered 2023-09-29: 40 mg via INTRAMUSCULAR

## 2023-09-29 NOTE — Progress Notes (Signed)
    Procedures performed today:    Procedure: Real-time Ultrasound Guided injection of the left elbow Device: Samsung HS60  Verbal informed consent obtained.  Time-out conducted.  Noted no overlying erythema, induration, or other signs of local infection.  Skin prepped in a sterile fashion.  Local anesthesia: Topical Ethyl chloride.  With sterile technique and under real time ultrasound guidance: Elbow effusion noted.  Using a posterior/lateral approach through the anconeus I entered an arthritic appearing joint, 1 cc Kenalog  40, 1 cc lidocaine, 1 cc bupivacaine injected easily Completed without difficulty  Advised to call if fevers/chills, erythema, induration, drainage, or persistent bleeding.  Images permanently stored and available for review in PACS.  Impression: Technically successful ultrasound guided injection.  Independent interpretation of notes and tests performed by another provider:   None.  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 We injected his elbow again. I need updated x-rays and he will restart his home physical therapy. Return to see me as needed.    ____________________________________________ Joselyn Nicely. Sandy Crumb, M.D., ABFM., CAQSM., AME. Primary Care and Sports Medicine [facility]  Adjunct Professor of [facility] Medicine  [employer]  Restaurant manager, fast food

## 2023-09-29 NOTE — Assessment & Plan Note (Signed)
 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 We injected his elbow again. I need updated x-rays and he will restart his home physical therapy. Return to see me as needed.

## 2023-10-06 ENCOUNTER — Ambulatory Visit: Payer: Self-pay | Admitting: Sports Medicine

## 2023-11-01 ENCOUNTER — Ambulatory Visit

## 2023-11-01 ENCOUNTER — Ambulatory Visit
Admission: EM | Admit: 2023-11-01 | Discharge: 2023-11-01 | Disposition: A | Discharge status: Home / Self Care | Attending: Family Medicine | Admitting: Family Medicine

## 2023-11-01 ENCOUNTER — Encounter: Payer: Self-pay | Admitting: Emergency Medicine

## 2023-11-01 DIAGNOSIS — M5030 Other cervical disc degeneration, unspecified cervical region: Secondary | ICD-10-CM

## 2023-11-01 DIAGNOSIS — M542 Cervicalgia: Secondary | ICD-10-CM

## 2023-11-01 MED ORDER — TIZANIDINE HCL 4 MG PO TABS: 4.0000 mg | ORAL_TABLET | Freq: Four times a day (QID) | ORAL | 0 refills | Status: AC | PRN

## 2023-11-01 MED ORDER — METHYLPREDNISOLONE 4 MG PO TBPK: ORAL_TABLET | ORAL | 0 refills | Status: AC

## 2023-11-01 NOTE — Discharge Instructions (Signed)
 Home to rest.  Put ice on your neck for 20 minutes when you get home Take the Dosepak as directed.  This will help as an anti-inflammatory Take tizanidine  at bedtime.  You may eat during the day but it can cause drowsiness See Dr. ONEIDA if not improving by next week

## 2023-11-01 NOTE — ED Provider Notes (Signed)
 [name]    CSN: [id number] Arrival date & time: 11/01/23  1752      History   Chief Complaint Chief Complaint  Patient presents with   Neck Pain    HPI Jake Pearson is a 68 y.o. male.   Patient states that he turned to go down the stairs and missed a step, falling down the stairs.  He landed over on his left hip.  His left hip was immediately painful but is now just sore.  He is here because he is having increasing stiffness and pain in his neck.  Limited movement.  This happened about an hour and a half ago.  No numbness or weakness into the arms.  He did not hit his head.  No loss of consciousness.  Denies prior neck problems.  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   Pulmonary embolism and infarction (HCC) 12/05/2020   Acute deep vein thrombosis (DVT) of proximal vein of left lower extremity (HCC) 12/05/2020   Sensation of chest tightness 02/22/2020   Personal history of COVID-19 02/22/2020   BPH associated with nocturia 05/30/2019   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   BPPV (benign paroxysmal positional vertigo), right 12/28/2018   Aortic atherosclerosis (HCC) 12/04/2018   Kidney stones 12/04/2018   Pleural effusion, right 12/04/2018   Lumbar degenerative disc disease 01/12/2018   Snoring 10/13/2017   Bladder diverticulum 10/13/2017   Fatty  liver 10/13/2017   Arthritis of right acromioclavicular joint 07/22/2017   Hearing loss of left ear 11/17/2016    Past Surgical History:  Procedure Laterality Date   ARTHROSCOPIC REPAIR ACL     KNEE ARTHROCENTESIS     TONSILLECTOMY  Age 58    VASECTOMY         Home Medications    Prior to Admission medications   Medication Sig Start Date End Date Taking? Authorizing Provider  methylPREDNISolone  (MEDROL  DOSEPAK) 4 MG TBPK tablet tad 11/01/23  Yes Maranda Jamee Jacob, MD  tiZANidine  (ZANAFLEX ) 4 MG tablet Take 1-2 tablets (4-8 mg total) by mouth every 6 (six) hours as needed for muscle spasms. 11/01/23  Yes Maranda Jamee Jacob, MD  acetaminophen  (TYLENOL ) 325 MG tablet Take 650 mg by mouth every 6 (six) hours as needed.    [provider]  albuterol  (VENTOLIN  HFA) 108 (90 Base) MCG/ACT inhaler Inhale 1-2 puffs into the lungs every 4 (four) hours as needed for wheezing or shortness of breath. 12/03/20   Alvan Dorothyann BIRCH, MD    Family History Family History  Problem Relation Age of Onset   Healthy Mother    Healthy Father    Heart attack Maternal Grandfather    Diabetes Maternal Aunt    Diabetes Maternal Uncle    Heart attack Maternal Uncle     Social History Social History   Tobacco Use   Smoking status:  Never   Smokeless tobacco: Never  Vaping Use   Vaping status: Never Used  Substance Use Topics   Alcohol use: Yes    Comment: rarely   Drug use: No     Allergies   Patient has no known allergies.   Review of Systems Review of Systems   Physical Exam Triage Vital Signs ED Triage Vitals  Encounter Vitals Group     BP 11/01/23 1804 (!) 144/87     Girls Systolic BP Percentile --      Girls Diastolic BP Percentile --      Boys Systolic BP Percentile --      Boys Diastolic BP Percentile --      Pulse Rate 11/01/23 1804 61     Resp 11/01/23 1804 18     Temp 11/01/23 1804 98.3 F (36.8 C)     Temp Source 11/01/23 1804 Oral     SpO2 11/01/23 1804  96 %     Weight 11/01/23 1802 175 lb (79.4 kg)     Height 11/01/23 1802 5' 8 (1.727 m)     Head Circumference --      Peak Flow --      Pain Score 11/01/23 1802 2     Pain Loc --      Pain Education --      Exclude from Growth Chart --    No data found.  Updated Vital Signs BP (!) 144/87 (BP Location: Left Arm)   Pulse 61   Temp 98.3 F (36.8 C) (Oral)   Resp 18   Ht 5' 8 (1.727 m)   Wt 79.4 kg   SpO2 96%   BMI 26.61 kg/m   Physical Exam Constitutional:      General: He is not in acute distress.    Appearance: He is well-developed.  HENT:     Head: Normocephalic and atraumatic.  Eyes:     Conjunctiva/sclera: Conjunctivae normal.     Pupils: Pupils are equal, round, and reactive to light.  Neck:     Comments: Patient can move his neck in any direction about 50% of normal.  Tenderness in the paraspinous cervical muscles.  Tenderness at the left occiput.  Mild tenderness in the upper body at the trapezius muscles.  Neurovascular exam of upper extremities is intact. Cardiovascular:     Rate and Rhythm: Normal rate.  Pulmonary:     Effort: Pulmonary effort is normal. No respiratory distress.  Abdominal:     General: There is no distension.     Palpations: Abdomen is soft.  Musculoskeletal:        General: Normal range of motion.     Cervical back: Normal range of motion. Tenderness present.  Skin:    General: Skin is warm and dry.  Neurological:     Mental Status: He is alert.     Gait: Gait normal.      UC Treatments / Results  Labs (all labs ordered are listed, but only abnormal results are displayed) Labs Reviewed - No data to display  EKG   Radiology DG Cervical Spine Complete Result Date: 11/01/2023 CLINICAL DATA:  Acute neck pain after fall. EXAM: CERVICAL SPINE - COMPLETE 4+ VIEW COMPARISON:  November 20, 2021. FINDINGS: There is no evidence of cervical spine fracture or prevertebral soft tissue swelling. Alignment is normal. Moderate anterior  osteophyte formation is noted at C3-4. Hypertrophy of left-sided posterior facet joints is noted at C4-5 and C5-6. Mild degenerative disc disease is noted  at C5-6 and C6-7. IMPRESSION: Multilevel degenerative changes as noted above. No acute abnormality seen. Electronically Signed   By: Lynwood Landy Raddle M.D.   On: 11/01/2023 18:58    Procedures Procedures (including critical care time)  Medications Ordered in UC Medications - No data to display  Initial Impression / Assessment and Plan / UC Course  I have reviewed the triage vital signs and the nursing notes.  Pertinent labs & imaging results that were available during my care of the patient were reviewed by me and considered in my medical decision making (see chart for details).     I showed the patient his x-rays.  He does have multilevel degenerative disc disease in his neck.  With his fall he is probably sprained his neck although he has no radiculopathy.  Will treat with anti-inflammatories, rest, ice, and muscle relaxers.  See Dr. ONEIDA if fails to improve Final Clinical Impressions(s) / UC Diagnoses   Final diagnoses:  Neck pain, acute  DDD (degenerative disc disease), cervical     Discharge Instructions      Home to rest.  Put ice on your neck for 20 minutes when you get home Take the Dosepak as directed.  This will help as an anti-inflammatory Take tizanidine  at bedtime.  You may eat during the day but it can cause drowsiness See Dr. ONEIDA if not improving by next week     ED Prescriptions     Medication Sig Dispense Auth. Provider   methylPREDNISolone  (MEDROL  DOSEPAK) 4 MG TBPK tablet tad 21 tablet Maranda Jamee Jacob, MD   tiZANidine  (ZANAFLEX ) 4 MG tablet Take 1-2 tablets (4-8 mg total) by mouth every 6 (six) hours as needed for muscle spasms. 21 tablet Maranda Jamee Jacob, MD      I have reviewed the PDMP during this encounter.   Maranda Jamee Jacob, MD 11/01/23 [phone]

## 2023-11-01 NOTE — ED Triage Notes (Signed)
 Patient states that he was going down some stairs and missed a stepped, which caused him to fall injuring his neck.  No LOC.  Denies any OTC pain meds.

## 2023-12-20 ENCOUNTER — Encounter: Payer: Self-pay | Admitting: Sports Medicine

## 2024-01-24 ENCOUNTER — Ambulatory Visit

## 2024-01-24 ENCOUNTER — Other Ambulatory Visit (INDEPENDENT_AMBULATORY_CARE_PROVIDER_SITE_OTHER): Payer: Self-pay

## 2024-01-24 VITALS — BP 100/78 | Ht 68.0 in | Wt 175.0 lb

## 2024-01-24 DIAGNOSIS — M1711 Unilateral primary osteoarthritis, right knee: Secondary | ICD-10-CM | POA: Diagnosis not present

## 2024-01-24 DIAGNOSIS — M25561 Pain in right knee: Secondary | ICD-10-CM

## 2024-01-24 DIAGNOSIS — G8929 Other chronic pain: Secondary | ICD-10-CM | POA: Diagnosis not present

## 2024-01-24 MED ORDER — METHYLPREDNISOLONE ACETATE 40 MG/ML IJ SUSP
40.0000 mg | Freq: Once | INTRAMUSCULAR | Status: AC
  Administered 2024-01-24: 40 mg via INTRA_ARTICULAR

## 2024-01-24 NOTE — Progress Notes (Signed)
 Subjective:    Patient ID: Jake Pearson, male    DOB: 68 y.o., [birth year]   MRN: [id number]  Chief Complaint: Right knee pain  Discussed the use of AI scribe software for clinical note transcription with the patient, who gave verbal consent to proceed.  History of Present Illness Jake Pearson is a 68 year old male who presents with a flare-up of knee pain.  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 - Pain worsens with descending stairs, but not with walking downhill - Occasional weakness and instability in the knee, with improvement over the past week - Knee soreness persisted throughout the summer months - Recent improvement in symptoms  Prior treatments and response - Not taking meloxicam  - Topical treatments such as Icy Hot are ineffective  Functional impact and activity concerns - Concerned about ability to participate in upcoming hiking trip involving photography of waterfalls  Relevant surgical history - History of ACL injury on contralateral knee, surgically repaired with staples  Previously evaluated and followed by Dr. Curtis Taking meloxicam  and engaging with physical therapy for management of this helped it improve. Today reports has experienced recent flare of this since June.  Review of pertinent imaging: 09/28/2022 4 view plain film radiographs obtained of the right knee per my independent review revealing moderate to severe degenerative narrowing noted in the medial and lateral compartments.  Osteophytosis noted around the patella (mild).  Bony spurring noted at the posterior aspect of the tibial plateau.     Objective:   There were no vitals filed for this visit.  Right knee (compared to normal) -Inspection: no significant swelling, erythema.   -Palpation: TTP - quad tendon, - patella, - patellar tendon, - tibial tuberosity, + pes bursa, - gerdy tubercle, + medial joint line, + lateral joint line, - posterior knee, - medial and lateral hamstrings.  moderate crepitus with flexion/extension. -AROM/PROM: 0 degrees extension, 115 degrees flexion, low hamstring flexibility -Strength: 5/5 flexion, 5/5 extension -Special tests:    -ACL: - lachman, - lever test   -MCL: stable and painless with valgus at 0/30 degrees   -LCL: stable and painless with varus at 0/30 degrees   -PCL: - sag sign   -Meniscus: - thessaly, - McMurray   -Patellofemoral: - patellar grind  Intra-articular Knee Injection with Ultrasound Guidance Procedure Note Brynn Reznik 1955-12-09 Indications: Pain Procedure Details Following the description of risks including infection, bleeding, damage to surrounding structures, patient provided written consent for right knee joint injection procedure. US  was used to identify the suprapatellar pouch. Patient was sterilely prepped in the usual fashion with alcohol.  Following topical anesthetization with ethyl chloride they were injected with a solution of 40mg  Depo-medrol  and 3cc Mepivacaine 2% Lidocaine via the superolateral approach into the suprapatellar pouch. This was well visualized under ultrasound, please see associated photographic documentation. Patient tolerated well without complication.  Precautions provided. Cleaned and dressing applied.    Assessment & Plan:   Assessment & Plan Right knee osteoarthritis   Chronic right knee osteoarthritis flared up after a slip incident. Pain is localized around the knee joint line, consistent with arthritis, without significant swelling, clicking, locking, or catching. Previous x-rays show moderate to severe degenerative changes in medial and lateral compartments. Symptoms have slightly improved over the past week but continue to affect activities  like descending stairs. The  differential diagnosis favors an arthritis flare over an acute injury. Treatment options discussed include corticosteroid injection, oral anti-inflammatory medication, topical anti-inflammatory, and physical therapy. He is interested in the injection to ensure optimal function for an upcoming hiking trip. Administer a corticosteroid injection to the right knee to alleviate inflammation and improve mobility. Recommend physical therapy to strengthen surrounding muscles and improve joint function. Advise monitoring symptoms and contacting if the condition worsens or does not improve.
